# Patient Record
Sex: Female | Born: 1995
Health system: Southern US, Community
[De-identification: ages and names within clinical notes are randomized; demographics above are authoritative.]

## PROBLEM LIST (undated history)

## (undated) DIAGNOSIS — R569 Unspecified convulsions: Secondary | ICD-10-CM

## (undated) HISTORY — PX: NO PAST SURGERIES: SHX2092

## (undated) HISTORY — DX: Unspecified convulsions: R56.9

---
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
## 2013-03-25 ENCOUNTER — Ambulatory Visit (INDEPENDENT_AMBULATORY_CARE_PROVIDER_SITE_OTHER): Payer: Managed Care, Other (non HMO) | Admitting: Family Medicine

## 2013-03-25 VITALS — BP 110/62 | HR 92 | Temp 97.9°F | Resp 18 | Ht 64.5 in | Wt 130.0 lb

## 2013-03-25 DIAGNOSIS — J029 Acute pharyngitis, unspecified: Secondary | ICD-10-CM

## 2013-03-25 LAB — POCT CBC
Granulocyte percent: 66.3 %G (ref 37–80)
HCT, POC: 44.7 % (ref 37.7–47.9)
Hemoglobin: 14 g/dL (ref 12.2–16.2)
Lymph, poc: 2.4 (ref 0.6–3.4)
MCHC: 31.3 g/dL — AB (ref 31.8–35.4)
MID (cbc): 0.6 (ref 0–0.9)
MPV: 11 fL (ref 0–99.8)
POC Granulocyte: 6 (ref 2–6.9)
POC MID %: 6.9 %M (ref 0–12)
RBC: 4.88 M/uL (ref 4.04–5.48)
RDW, POC: 14.6 %
WBC: 9 10*3/uL (ref 4.6–10.2)

## 2013-03-25 LAB — POCT RAPID STREP A (OFFICE): Rapid Strep A Screen: NEGATIVE

## 2013-03-25 MED ORDER — AZITHROMYCIN 250 MG PO TABS: ORAL_TABLET | ORAL | Status: DC

## 2013-03-25 NOTE — Progress Notes (Addendum)
[facility] [address] [place] [address] [phone]  Date:  03/25/2013   Name:  Kaitlyn Kim   DOB:  1996/05/17   MRN:  [id number]  PCP:  Carmin Richmond, MD    Chief Complaint: Sore Throat   History of Present Illness:  Timmy Cleverly is a 17 y.o. very pleasant female patient who presents with the following:  She has noted a sore and red throat for about 3 weeks- it has been getting worse.  Several of her friends at [employer] have the same thing.   She is not aware of any fever, no chills or aches.   She has had a cough, runny nose, headaches.  No fatigue.   No GI symptms.  She did have a seizure several years ago, but this has not occurred again.  She is otherwise generally healthy.   Here today with her mom  She is currently studying to be a Engineer, civil (consulting).    Patient Active Problem List   Diagnosis Date Noted  . Generalized nonconvulsive epilepsy without mention of intractable epilepsy 09/11/2012  . Transient alteration of awareness 09/11/2012    Past Medical History  Diagnosis Date  . Seizures     HX of seizures 3 years ago    Past Surgical History  Procedure Laterality Date  . Epilepsy      History  Substance Use Topics  . Smoking status: Never Smoker   . Smokeless tobacco: Never Used  . Alcohol Use: Yes     Comment: Patient rarely drinks alcohol.    Family History  Problem Relation Age of Onset  . Hyperlipidemia Mother   . Hypertension Maternal Grandmother   . ALS Maternal Grandmother   . Cancer Paternal Grandmother     lung cancer  . COPD Paternal Grandmother   . COPD Paternal Grandfather     No Known Allergies  Medication list has been reviewed and updated.  No current outpatient prescriptions on file prior to visit.   No current facility-administered medications on file prior to visit.    Review of Systems:  As per HPI- otherwise negative.   Physical Examination: Filed Vitals:   03/25/13 1238  BP: 110/62  Pulse:  92  Temp: 97.9 F (36.6 C)  Resp: 18   Filed Vitals:   03/25/13 1238  Height: 5' 4.5" (1.638 m)  Weight: 130 lb (58.968 kg)   Body mass index is 21.98 kg/(m^2). Ideal Body Weight: Weight in (lb) to have BMI = 25: 147.6  GEN: WDWN, NAD, Non-toxic, A & O x 3, looks well HEENT: Atraumatic, Normocephalic. Neck supple. No masses, No LAD.  Oropharynx injected bilaterally with minimal exudate, no evidence of abscess.  Nasal cavity congested.  PEERL Ears and Nose: No external deformity. CV: RRR, No M/G/R. No JVD. No thrill. No extra heart sounds. PULM: CTA B, no wheezes, crackles, rhonchi. No retractions. No resp. distress. No accessory muscle use. ABD: S, NT, ND. No rebound. No HSM. EXTR: No c/c/e NEURO Normal gait.  PSYCH: Normally interactive. Conversant. Not depressed or anxious appearing.  Calm demeanor.   Results for orders placed in visit on 03/25/13  POCT RAPID STREP A (OFFICE)      Result Value Range   Rapid Strep A Screen Negative  Negative  POCT CBC      Result Value Range   WBC 9.0  4.6 - 10.2 K/uL   Lymph, poc 2.4  0.6 - 3.4   POC LYMPH PERCENT 26.8  10 - 50 %L   MID (cbc) 0.6  0 - 0.9   POC MID % 6.9  0 - 12 %M   POC Granulocyte 6.0  2 - 6.9   Granulocyte percent 66.3  37 - 80 %G   RBC 4.88  4.04 - 5.48 M/uL   Hemoglobin 14.0  12.2 - 16.2 g/dL   HCT, POC 40.9  81.1 - 47.9 %   MCV 91.6  80 - 97 fL   MCH, POC 28.7  27 - 31.2 pg   MCHC 31.3 (*) 31.8 - 35.4 g/dL   RDW, POC 91.4     Platelet Count, POC 227  142 - 424 K/uL   MPV 11.0  0 - 99.8 fL     Assessment and Plan: Acute pharyngitis - Plan: POCT rapid strep A, POCT CBC, Culture, Group A Strep, Epstein-Barr virus VCA antibody panel, azithromycin (ZITHROMAX) 250 MG tablet  Pharyngitis suggestive of bacterial infection, also consider EBV.  Treat with azithromycin.  If not better please let me know.  Will plan further follow- up pending labs.  Signed Abbe Amsterdam, MD  10/16:  Called to check on her. LMOM.   Mono titer suggestive of recent EBV infection.  Be sure to rest, avoid injury to abdomen and conserve energy for school.  Throat culture negative for strep.  If not better soon please let me know.

## 2013-03-25 NOTE — Patient Instructions (Signed)
I will be in touch with the rest of your labs. If you are getting worse in the meantime please let me know.

## 2013-03-26 LAB — EPSTEIN-BARR VIRUS VCA ANTIBODY PANEL
EBV EA IgG: 9.9 U/mL — ABNORMAL HIGH (ref <9.0)
EBV NA IgG: 214 U/mL — ABNORMAL HIGH (ref <18.0)
EBV VCA IgG: 218 U/mL — ABNORMAL HIGH (ref <18.0)
EBV VCA IgM: >160 U/mL — ABNORMAL HIGH (ref <36.0)

## 2013-03-27 LAB — CULTURE, GROUP A STREP: Organism ID, Bacteria: NORMAL

## 2013-03-28 ENCOUNTER — Encounter: Payer: Self-pay | Admitting: Family Medicine

## 2013-12-11 ENCOUNTER — Ambulatory Visit (INDEPENDENT_AMBULATORY_CARE_PROVIDER_SITE_OTHER): Payer: Managed Care, Other (non HMO) | Admitting: Family Medicine

## 2013-12-11 VITALS — BP 102/80 | HR 63 | Temp 98.0°F | Resp 18 | Ht 64.5 in | Wt 132.6 lb

## 2013-12-11 DIAGNOSIS — L509 Urticaria, unspecified: Secondary | ICD-10-CM

## 2013-12-11 MED ORDER — HYDROXYZINE HCL 10 MG PO TABS: 10.0000 mg | ORAL_TABLET | Freq: Three times a day (TID) | ORAL | Status: DC | PRN

## 2013-12-11 NOTE — Progress Notes (Signed)
   Subjective:    Patient ID: Kaitlyn Kim, female    DOB: 05-19-1996, 18 y.o.   MRN: [id number]  HPI This is a very pleasant 18 yo female who is accompanied by her mother. Patient has been having generalized itching and intermittent, daily hives for about 4 months. She is currently home from college for the summer. [facility] at the onset of symptoms and was given triamcinolone cream which did not help. She was seen at an urgent care center in [place] and was prescribed a week of medication that she thinks was prednisone (end of April). This helped a great deal but did not clear her symptoms completely. She has tried elimination of various foods and body lotions/soaps/shampoos. Has recently taken zyrtec daily without relief. She has no history of medication/food/plant allergies.  Did not take any medication prior to the onset of hives. States that she was sick off an on all year while at college. She had mono in the fall and the flu in the spring. She admits to having a stressful year, but reports that her stress level is currently low while at home for the summer.  No current medical problems. Had childhood epilepsy- resolved No surgery  Review of Systems No current medication, no runny nose, no itchy eyes, no sore throat.    Objective:   Physical Exam  Vitals reviewed. Constitutional: She appears well-developed and well-nourished.  HENT:  Head: Normocephalic and atraumatic.  Right Ear: External ear normal.  Left Ear: External ear normal.  Nose: Nose normal.  Mouth/Throat: Oropharynx is clear and moist. No oropharyngeal exudate.  Eyes: Conjunctivae are normal. Right eye exhibits no discharge. Left eye exhibits no discharge. No scleral icterus.  Neck: Normal range of motion. Neck supple.  Cardiovascular: Normal rate, regular rhythm and normal heart sounds.   Pulmonary/Chest: Effort normal and breath sounds normal.  Musculoskeletal: Normal range of motion.    Lymphadenopathy:    She has no cervical adenopathy.  Neurological: She is alert.  Skin: Skin is warm and dry. Rash: right inner thigh with 3 small erythematous papules. Patient and mother of pictures of what appears to be scattered wheals. on patient's thighs.  Psychiatric: She has a normal mood and affect. Her behavior is normal. Judgment and thought content normal.      Assessment & Plan:  1. Urticaria - hydrOXYzine (ATARAX/VISTARIL) 10 MG tablet; Take 1 tablet (10 mg total) by mouth 3 (three) times daily as needed for itching.  Dispense: 30 tablet; Refill: 0 - Ambulatory referral to Dermatology  2. Hives - Ambulatory referral to Dermatology  -Discussed possible causes with patient and her mother- post viral, physical/psychological stress reaction.   Emi Belfasteborah B. Gessner, FNP-BC  [facility]  12/11/2013 8:50 PM

## 2013-12-11 NOTE — Patient Instructions (Signed)
Long acting antihistamine daily until referral appointment Atarax every 4-6 hours as needed for itching- may be sedating  Hives Hives are itchy, red, swollen areas of the skin. They can vary in size and location on your body. Hives can come and go for hours or several days (acute hives) or for several weeks (chronic hives). Hives do not spread from person to person (noncontagious). They may get worse with scratching, exercise, and emotional stress. CAUSES   Allergic reaction to food, additives, or drugs.  Infections, including the common cold.  Illness, such as vasculitis, lupus, or thyroid disease.  Exposure to sunlight, heat, or cold.  Exercise.  Stress.  Contact with chemicals. SYMPTOMS   Red or white swollen patches on the skin. The patches may change size, shape, and location quickly and repeatedly.  Itching.  Swelling of the hands, feet, and face. This may occur if hives develop deeper in the skin. DIAGNOSIS  Your caregiver can usually tell what is wrong by performing a physical exam. Skin or blood tests may also be done to determine the cause of your hives. In some cases, the cause cannot be determined. TREATMENT  Mild cases usually get better with medicines such as antihistamines. Severe cases may require an emergency epinephrine injection. If the cause of your hives is known, treatment includes avoiding that trigger.  HOME CARE INSTRUCTIONS   Avoid causes that trigger your hives.  Take antihistamines as directed by your caregiver to reduce the severity of your hives. Non-sedating or low-sedating antihistamines are usually recommended. Do not drive while taking an antihistamine.  Take any other medicines prescribed for itching as directed by your caregiver.  Wear loose-fitting clothing.  Keep all follow-up appointments as directed by your caregiver. SEEK MEDICAL CARE IF:   You have persistent or severe itching that is not relieved with medicine.  You have painful  or swollen joints. SEEK IMMEDIATE MEDICAL CARE IF:   You have a fever.  Your tongue or lips are swollen.  You have trouble breathing or swallowing.  You feel tightness in the throat or chest.  You have abdominal pain. These problems may be the first sign of a life-threatening allergic reaction. Call your local emergency services (911 in [place] MAKE SURE YOU:   Understand these instructions.  Will watch your condition.  Will get help right away if you are not doing well or get worse. Document Released: 05/30/2005 Document Revised: 06/04/2013 Document Reviewed: 08/23/2011 [facility] Patient Information 2015 [employer] [place]. This information is not intended to replace advice given to you by your health care provider. Make sure you discuss any questions you have with your health care provider.

## 2013-12-12 ENCOUNTER — Telehealth: Payer: Self-pay

## 2013-12-12 MED ORDER — PREDNISONE 20 MG PO TABS: ORAL_TABLET | ORAL | Status: DC

## 2013-12-12 NOTE — Telephone Encounter (Signed)
MOTHER CALLED ON BEHALF OF PATIENT STATING THAT WHEN THE PATIENT WAS SEEN YESTERDAY FOR ITCHINESS SHE WAS PRESCRIBED A MEDICATION THAT ISN'T HELPING. THE MOTHER ALSO SAID THAT THE PATIENT IS ITCHING REALLY BAD, CRYING AND WOULD LIKE TO KNOW WHAT SHE CAN DO. PLEASE CALL PATINETS' MOTHER! [phone]

## 2013-12-12 NOTE — Telephone Encounter (Signed)
please advise.

## 2013-12-12 NOTE — Telephone Encounter (Signed)
Will send prednisone taper to pharmacy. She needs to continue the atarax as prescribed as well as the zyrtec. F/u with derm as soon as she has an appt

## 2013-12-13 NOTE — Telephone Encounter (Signed)
Lm for pt to rtn call- rx sent to pharmacy.

## 2014-06-19 ENCOUNTER — Ambulatory Visit (INDEPENDENT_AMBULATORY_CARE_PROVIDER_SITE_OTHER): Payer: PRIVATE HEALTH INSURANCE | Admitting: Family Medicine

## 2014-06-19 VITALS — BP 120/70 | HR 74 | Temp 98.0°F | Resp 16 | Ht 64.0 in | Wt 127.6 lb

## 2014-06-19 DIAGNOSIS — L299 Pruritus, unspecified: Secondary | ICD-10-CM

## 2014-06-19 DIAGNOSIS — R21 Rash and other nonspecific skin eruption: Secondary | ICD-10-CM

## 2014-06-19 MED ORDER — RANITIDINE HCL 150 MG PO TABS: 150.0000 mg | ORAL_TABLET | Freq: Two times a day (BID) | ORAL | Status: DC

## 2014-06-19 MED ORDER — CETIRIZINE HCL 10 MG PO TABS: 10.0000 mg | ORAL_TABLET | Freq: Every day | ORAL | Status: DC

## 2014-06-19 MED ORDER — METHYLPREDNISOLONE 32 MG PO TABS: ORAL_TABLET | ORAL | Status: DC

## 2014-06-19 NOTE — Progress Notes (Signed)
   Subjective:    Patient ID: Kaitlyn Kim, female    DOB: [birth year], 19 y.o.   MRN: [id number]  HPI Patient presents with rash on neck, torso, arms, hands, legs, and feet that started May 2015. Rash improves and worsens without warning, but is always there. It itches intensely, but is not painful. Rash not worse with cold, dry air or following baths. No new soaps or detergents, however, does use scented body wash. Has tried discontinuing different foods in her diet, but nothing changed. Has been seen at 3 differently facilities including [facility] and was thought to have eczema, scabies, and an allergic reaction. Was treated with Triamcinolone, Ivermectin, Prednisone, and Methylprednisolone. Roommate in college did not get scabies and that treatment did not help. Triamcinolone gave some improvement. Prednisone was not tolerated. Methylprednisolone helped the most, however, only had five days worth of medication and upon completion of pills rash came back. Not currently taking any medications. NKDA.   Review of Systems  Constitutional: Negative for fever, chills and fatigue.  Eyes: Negative for itching.  Respiratory: Negative for shortness of breath and wheezing.   Skin: Positive for rash (spares face and back). Negative for color change, pallor and wound.  Allergic/Immunologic: Negative for environmental allergies and food allergies.  Hematological: Negative for adenopathy.       Objective:   Physical Exam  Constitutional: She is oriented to person, place, and time. She appears well-developed and well-nourished. No distress.  Blood pressure 120/70, pulse 74, temperature 98 F (36.7 C), temperature source Oral, resp. rate 16, height 5\' 4"  (1.626 m), weight 127 lb 9.6 oz (57.879 kg), last menstrual period 06/11/2014, SpO2 97 %.  HENT:  Head: Normocephalic and atraumatic.  Right Ear: External ear normal.  Left Ear: External ear normal.  Eyes: Right eye exhibits no discharge. Left eye exhibits no  discharge. No scleral icterus.  Neck: Normal range of motion. Neck supple.  Lymphadenopathy:    She has no cervical adenopathy.  Neurological: She is alert and oriented to person, place, and time.  Skin: Skin is warm and dry. Rash (diffuse sparing the face and back. Fine, red papules. Dermatographic reaction present.) noted. She is not diaphoretic. No erythema. No pallor.       Assessment & Plan:  1. Itching 2. Rash and nonspecific skin eruption If no improvement with current treatment can refer to allergist. - cetirizine (ZYRTEC) 10 MG tablet; Take 1 tablet (10 mg total) by mouth daily.  Dispense: 30 tablet; Refill: 11 - ranitidine (ZANTAC) 150 MG tablet; Take 1 tablet (150 mg total) by mouth 2 (two) times daily.  Dispense: 60 tablet; Refill: 0 - methylPREDNISolone (MEDROL) 32 MG tablet; Take 48 mg x2 day, 32 mg x3 days, 16 mg x5 days, 8 mg x2 days  Dispense: 10 tablet; Refill: 0   Orpah Hausner PA-C  [facility] 06/19/2014 6:28 PM

## 2014-07-09 ENCOUNTER — Telehealth: Payer: Self-pay

## 2014-07-09 NOTE — Telephone Encounter (Signed)
Dr. Conley RollsLe so you have any other suggestions? Should pt RTC?

## 2014-07-09 NOTE — Telephone Encounter (Signed)
Patient has been seen at our office recently for hives/rash. Per patients mom she is still broke out and they are not sure what to do. Mom is requesting to speak with a nurse to get advice on what they should do next. Cathy's (mom) call back number is [phone]

## 2014-07-09 NOTE — Telephone Encounter (Signed)
Spoke with mom, patient in class, she is ok with mom talking to me. Kaitlyn Kim has had this for over a year, she has tried many different things, steroids help but once she is on the downward taper it returns. She ahs seen derm before but was not actively itching so nothing was done, mom is worried about systemic causes which is reasonable and would like a referral to allergiest but inurance wont cover for allergy testing. Advise to call them again and ask specifically about urticaria and also if cover dermatology. Will refer prn once she gets info. She can return to office for basic blood work and possible gluten allergy testing.

## 2014-07-10 ENCOUNTER — Ambulatory Visit (INDEPENDENT_AMBULATORY_CARE_PROVIDER_SITE_OTHER): Payer: PRIVATE HEALTH INSURANCE | Admitting: Family Medicine

## 2014-07-10 VITALS — BP 110/64 | HR 83 | Temp 98.7°F | Resp 18 | Ht 64.0 in | Wt 128.2 lb

## 2014-07-10 DIAGNOSIS — R21 Rash and other nonspecific skin eruption: Secondary | ICD-10-CM

## 2014-07-10 MED ORDER — TRIAMCINOLONE ACETONIDE 0.1 % EX CREA: 1.0000 "application " | TOPICAL_CREAM | Freq: Two times a day (BID) | CUTANEOUS | Status: DC

## 2014-07-10 MED ORDER — MONTELUKAST SODIUM 10 MG PO TABS: 10.0000 mg | ORAL_TABLET | Freq: Every day | ORAL | Status: DC

## 2014-07-10 NOTE — Progress Notes (Signed)
[facility] [address], [place] [id number] [phone]  Date:  07/10/2014   Name:  Kaitlyn Kim   DOB:  1995-12-19   MRN:  [id number]  PCP:  Carmin RichmondLARK,WILLIAM D, MD    Chief Complaint: Follow-up   History of Present Illness:  Kaitlyn Pigglicia Laforest is a 19 y.o. very pleasant female patient who presents with the following:  She is here today to follow-up a rash.  She has had this rash since 09/2013.  We saw her in July and she was given atarax.  She has seen dermatology but was told they did not know the cause of her rash, they suggested an allergist. However it seems that her insurance will not pay for her to see an allergist. They would like to get new, better insurance soon.    She was here on 1/7 and was treated with zyrtec, zantac, medrol dose pack. She did do better with the medrol until she dropped to the lower dosage, then her sx returned.  She is not taking zyrtec, not taking benadryl.  Not taking anything right now. OTC meds do not seem to help except the benadryl helps her to sleep.    Normal TSH last summer.  OW she has not really had labs for this so far.   Sx first started at [employer].  Her roommates did not have any sx.  She is now home with her family and now one else in her family has any of these sx.    Patient Active Problem List   Diagnosis Date Noted  . Generalized nonconvulsive epilepsy without mention of intractable epilepsy 09/11/2012  . Transient alteration of awareness 09/11/2012    Past Medical History  Diagnosis Date  . Seizures     HX of seizures 3 years ago    Past Surgical History  Procedure Laterality Date  . Epilepsy      History  Substance Use Topics  . Smoking status: Never Smoker   . Smokeless tobacco: Never Used  . Alcohol Use: Yes     Comment: Patient rarely drinks alcohol.    Family History  Problem Relation Age of Onset  . Hyperlipidemia Mother   . Hypertension Maternal Grandmother   . ALS Maternal Grandmother   .  Cancer Paternal Grandmother     lung cancer  . COPD Paternal Grandmother   . COPD Paternal Grandfather     No Known Allergies  Medication list has been reviewed and updated.  Current Outpatient Prescriptions on File Prior to Visit  Medication Sig Dispense Refill  . cetirizine (ZYRTEC) 10 MG tablet Take 1 tablet (10 mg total) by mouth daily. (Patient not taking: Reported on 07/10/2014) 30 tablet 11  . hydrOXYzine (ATARAX/VISTARIL) 10 MG tablet Take 1 tablet (10 mg total) by mouth 3 (three) times daily as needed for itching. (Patient not taking: Reported on 06/19/2014) 30 tablet 0  . methylPREDNISolone (MEDROL) 32 MG tablet Take 48 mg x2 day, 32 mg x3 days, 16 mg x5 days, 8 mg x2 days (Patient not taking: Reported on 07/10/2014) 10 tablet 0  . predniSONE (DELTASONE) 20 MG tablet Take 3 PO QAM x3days, 2 PO QAM x3days, 1 PO QAM x3days (Patient not taking: Reported on 06/19/2014) 18 tablet 0  . ranitidine (ZANTAC) 150 MG tablet Take 1 tablet (150 mg total) by mouth 2 (two) times daily. (Patient not taking: Reported on 07/10/2014) 60 tablet 0   No current facility-administered medications on file prior to visit.  Review of Systems:  As per HPI- otherwise negative.   Physical Examination: Filed Vitals:   07/10/14 1639  BP: 110/64  Pulse: 83  Temp: 98.7 F (37.1 C)  Resp: 18   Filed Vitals:   07/10/14 1639  Height:  (1.626 m)  Weight: 128 lb 3.2 oz (58.151 kg)   Body mass index is 21.99 kg/(m^2). Ideal Body Weight: Weight in (lb) to have BMI = 25: 145.3  GEN: WDWN, NAD, Non-toxic, A & O x 3, looks well HEENT: Atraumatic, Normocephalic. Neck supple. No masses, No LAD.  Bilateral TM wnl, oropharynx normal.  PEERL,EOMI.   Ears and Nose: No external deformity. CV: RRR, No M/G/R. No JVD. No thrill. No extra heart sounds. PULM: CTA B, no wheezes, crackles, rhonchi. No retractions. No resp. distress. No accessory muscle use. EXTR: No c/c/e NEURO Normal gait.  PSYCH: Normally  interactive. Conversant. Not depressed or anxious appearing.  Calm demeanor.  Looks well.  Mild, scattered, discrete papular palpable rash on her trunk and legs. Most on her abdomen   Assessment and Plan: Rash and nonspecific skin eruption - Plan: CBC, Comprehensive metabolic panel, C-reactive protein, Sedimentation rate, montelukast (SINGULAIR) 10 MG tablet, triamcinolone cream (KENALOG) 0.1 %  Non- specific rash of nearly a year's duration.   Await labs as above.  Will be conservative with testing for now as cost is an issue. She will try singulair, ok to use benadryl at night She may try some triamcinolone topically as well See patient instructions for more details.     Signed Abbe Amsterdam, MD

## 2014-07-10 NOTE — Patient Instructions (Addendum)
I will be in touch with your labs asap.  Let me know if you are getting worse  Try the singulair once a day to see if it will help.   Take short, lukewarm showers and apply cetaphil as soon as you get out.  Keep your skin moist! Protect from cold or heat.    You can also use some of the triamcinolone cream a few times a week on your skin as needed.  Not on your face

## 2014-07-11 LAB — COMPREHENSIVE METABOLIC PANEL
ALT: 11 U/L (ref 0–35)
AST: 13 U/L (ref 0–37)
Albumin: 4.3 g/dL (ref 3.5–5.2)
Alkaline Phosphatase: 56 U/L (ref 39–117)
BUN: 14 mg/dL (ref 6–23)
CO2: 23 mEq/L (ref 19–32)
Calcium: 9.1 mg/dL (ref 8.4–10.5)
Chloride: 105 mEq/L (ref 96–112)
Creat: 0.74 mg/dL (ref 0.50–1.10)
Glucose, Bld: 84 mg/dL (ref 70–99)
POTASSIUM: 4.2 meq/L (ref 3.5–5.3)
Sodium: 139 mEq/L (ref 135–145)
Total Bilirubin: 0.5 mg/dL (ref 0.2–1.2)
Total Protein: 6.5 g/dL (ref 6.0–8.3)

## 2014-07-11 LAB — C-REACTIVE PROTEIN

## 2014-07-11 LAB — CBC
HCT: 38.2 % (ref 36.0–46.0)
Hemoglobin: 12.7 g/dL (ref 12.0–15.0)
MCH: 28.6 pg (ref 26.0–34.0)
MCHC: 33.2 g/dL (ref 30.0–36.0)
MCV: 86 fL (ref 78.0–100.0)
MPV: 12.4 fL (ref 8.6–12.4)
PLATELETS: 186 10*3/uL (ref 150–400)
RBC: 4.44 MIL/uL (ref 3.87–5.11)
RDW: 13.3 % (ref 11.5–15.5)
WBC: 5.9 10*3/uL (ref 4.0–10.5)

## 2014-07-11 LAB — SEDIMENTATION RATE: SED RATE: 1 mm/h (ref 0–22)

## 2014-07-12 ENCOUNTER — Encounter: Payer: Self-pay | Admitting: Family Medicine

## 2014-07-13 ENCOUNTER — Other Ambulatory Visit: Payer: Self-pay | Admitting: Physician Assistant

## 2014-07-13 DIAGNOSIS — L299 Pruritus, unspecified: Secondary | ICD-10-CM

## 2014-07-15 NOTE — Telephone Encounter (Signed)
Dr Patsy Lageropland, do you want pt taking this? RF req'd but pt reported not taking at the 07/10/14 OV, wasn't on your plan. OK to RF?

## 2015-09-16 ENCOUNTER — Ambulatory Visit (INDEPENDENT_AMBULATORY_CARE_PROVIDER_SITE_OTHER): Payer: PRIVATE HEALTH INSURANCE | Admitting: Urgent Care

## 2015-09-16 VITALS — BP 120/60 | HR 80 | Temp 98.3°F | Resp 18 | Ht 64.5 in | Wt 124.0 lb

## 2015-09-16 DIAGNOSIS — M542 Cervicalgia: Secondary | ICD-10-CM

## 2015-09-16 DIAGNOSIS — M25511 Pain in right shoulder: Secondary | ICD-10-CM

## 2015-09-16 DIAGNOSIS — M6249 Contracture of muscle, multiple sites: Secondary | ICD-10-CM

## 2015-09-16 DIAGNOSIS — M62838 Other muscle spasm: Secondary | ICD-10-CM

## 2015-09-16 MED ORDER — CYCLOBENZAPRINE HCL 5 MG PO TABS: 5.0000 mg | ORAL_TABLET | Freq: Three times a day (TID) | ORAL | Status: DC | PRN

## 2015-09-16 MED ORDER — NAPROXEN SODIUM 550 MG PO TABS: 550.0000 mg | ORAL_TABLET | Freq: Two times a day (BID) | ORAL | Status: DC

## 2015-09-16 NOTE — Patient Instructions (Addendum)
Muscle Cramps and Spasms Muscle cramps and spasms occur when a muscle or muscles tighten and you have no control over this tightening (involuntary muscle contraction). They are a common problem and can develop in any muscle. The most common place is in the calf muscles of the leg. Both muscle cramps and muscle spasms are involuntary muscle contractions, but they also have differences:   Muscle cramps are sporadic and painful. They may last a few seconds to a quarter of an hour. Muscle cramps are often more forceful and last longer than muscle spasms.  Muscle spasms may or may not be painful. They may also last just a few seconds or much longer. CAUSES  It is uncommon for cramps or spasms to be due to a serious underlying problem. In many cases, the cause of cramps or spasms is unknown. Some common causes are:   Overexertion.   Overuse from repetitive motions (doing the same thing over and over).   Remaining in a certain position for a long period of time.   Improper preparation, form, or technique while performing a sport or activity.   Dehydration.   Injury.   Side effects of some medicines.   Abnormally low levels of the salts and ions in your blood (electrolytes), especially potassium and calcium. This could happen if you are taking water pills (diuretics) or you are pregnant.  Some underlying medical problems can make it more likely to develop cramps or spasms. These include, but are not limited to:   Diabetes.   Parkinson disease.   Hormone disorders, such as thyroid problems.   Alcohol abuse.   Diseases specific to muscles, joints, and bones.   Blood vessel disease where not enough blood is getting to the muscles.  HOME CARE INSTRUCTIONS   Stay well hydrated. Drink enough water and fluids to keep your urine clear or pale yellow.  It may be helpful to massage, stretch, and relax the affected muscle.  For tight or tense muscles, use a warm towel, heating  pad, or hot shower water directed to the affected area.  If you are sore or have pain after a cramp or spasm, applying ice to the affected area may relieve discomfort.  Put ice in a plastic bag.  Place a towel between your skin and the bag.  Leave the ice on for 15-20 minutes, 03-04 times a day.  Medicines used to treat a known cause of cramps or spasms may help reduce their frequency or severity. Only take over-the-counter or prescription medicines as directed by your caregiver. SEEK MEDICAL CARE IF:  Your cramps or spasms get more severe, more frequent, or do not improve over time.  MAKE SURE YOU:   Understand these instructions.  Will watch your condition.  Will get help right away if you are not doing well or get worse.   This information is not intended to replace advice given to you by your health care provider. Make sure you discuss any questions you have with your health care provider.   Document Released: 11/19/2001 Document Revised: 09/24/2012 Document Reviewed: 05/16/2012 Elsevier Interactive Patient Education 2016 [employer].     IF you received an x-ray today, you will receive an invoice from [facility]. Please contact [facility] at [phone] with questions or concerns regarding your invoice.   IF you received labwork today, you will receive an invoice from [employer]. Please contact [facility] at [phone] with questions or concerns regarding your invoice.   Our billing staff  will not be able to assist you with questions regarding bills from these companies.  You will be contacted with the lab results as soon as they are available. The fastest way to get your results is to activate your My Chart account. Instructions are located on the last page of this paperwork. If you have not heard from [place] regarding the results in 2 weeks, please contact this office.

## 2015-09-16 NOTE — Progress Notes (Signed)
    MRN: [id number] DOB: 18-Feb-1996  Subjective:   Kaitlyn Kim is a 20 y.o. female presenting for chief complaint of Back Pain  Reports ~1 week history of neck and upper back pain, R>L. The pain is achy with intermittent sharp pains, radiates to her right arm and feels like a throbbing sensation there, also has numbness and tingling of her right hand. Has tried Advil and APAP with some relief. 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 Denies trauma, weakness. Has a history of seizures, has not had one for the past 6 years and is not currently taking any medications. Denies smoking cigarettes or drinking alcohol.   Kaitlyn Kim currently has no medications in their medication list. Also has No Known Allergies.  Kaitlyn Kim  has a past medical history of Seizures (HCC). Also  has past surgical history that includes epilepsy.  Objective:   Vitals: BP 120/60 mmHg  Pulse 80  Temp(Src) 98.3 F (36.8 C) (Oral)  Resp 18  Ht 5' 4.5" (1.638 m)  Wt 124 lb (56.246 kg)  BMI 20.96 kg/m2  SpO2 98%  Physical Exam  Constitutional: She is oriented to person, place, and time. She appears well-developed and well-nourished.  Eyes: EOM are normal. Pupils are equal, round, and reactive to light.  Cardiovascular: Normal rate.   Pulmonary/Chest: Effort normal.  Musculoskeletal:       Cervical back: She exhibits tenderness (over area outlined) and spasm (over trapezius bilaterally, R>>L). She exhibits normal range of motion, no bony tenderness, no swelling, no edema, no deformity and no laceration.       Back:  Mildly positive Hawkin test. Negative Spurling, Neer, Empty Can tests.  Neurological: She is alert and oriented to person, place, and time. She has normal reflexes. Coordination normal.  Skin: Skin is warm and dry.   Assessment and Plan :   1. Neck pain 2. Cervical paraspinal muscle spasm 3. Right shoulder  pain - Likely due to over-use. Counseled on rest and modifications of activities, work restrictions provided. Patient is to start NSAID with Flexeril. Recheck in 2 weeks if no improvement. Consider imaging, PT, referral to ortho.  Kaitlyn BambergMario Syler Norcia, PA-C [facility] [phone] 09/16/2015 5:06 PM

## 2015-10-14 ENCOUNTER — Ambulatory Visit (INDEPENDENT_AMBULATORY_CARE_PROVIDER_SITE_OTHER): Payer: PRIVATE HEALTH INSURANCE | Admitting: Physician Assistant

## 2015-10-14 VITALS — BP 110/72 | HR 112 | Temp 98.9°F | Resp 18 | Ht 64.5 in | Wt 120.0 lb

## 2015-10-14 DIAGNOSIS — J029 Acute pharyngitis, unspecified: Secondary | ICD-10-CM | POA: Diagnosis not present

## 2015-10-14 LAB — POCT RAPID STREP A (OFFICE): RAPID STREP A SCREEN: NEGATIVE

## 2015-10-14 MED ORDER — AMOXICILLIN 875 MG PO TABS: 875.0000 mg | ORAL_TABLET | Freq: Two times a day (BID) | ORAL | Status: AC

## 2015-10-14 NOTE — Patient Instructions (Addendum)
   IF you received an x-ray today, you will receive an invoice from [facility]. Please contact [facility] at [phone] with questions or concerns regarding your invoice.   IF you received labwork today, you will receive an invoice from [facility]. Please contact [facility] at [phone] with questions or concerns regarding your invoice.   Our billing staff will not be able to assist you with questions regarding bills from these companies.  You will be contacted with the lab results as soon as they are available. The fastest way to get your results is to activate your My Chart account. Instructions are located on the last page of this paperwork. If you have not heard from us regarding the results in 2 weeks, please contact this office.    Strep Throat Strep throat is a bacterial infection of the throat. Your health care provider may call the infection tonsillitis or pharyngitis, depending on whether there is swelling in the tonsils or at the back of the throat. Strep throat is most common during the cold months of the year in children who are 5-15 years of age, but it can happen during any season in people of any age. This infection is spread from person to person (contagious) through coughing, sneezing, or close contact. CAUSES Strep throat is caused by the bacteria called Streptococcus pyogenes. RISK FACTORS This condition is more likely to develop in:  People who spend time in crowded places where the infection can spread easily.  People who have close contact with someone who has strep throat. SYMPTOMS Symptoms of this condition include:  Fever or chills.   Redness, swelling, or pain in the tonsils or throat.  Pain or difficulty when swallowing.  White or yellow spots on the tonsils or throat.  Swollen, tender glands in the neck or under the jaw.  Red rash all over the body (rare). DIAGNOSIS This condition is diagnosed by  performing a rapid strep test or by taking a swab of your throat (throat culture test). Results from a rapid strep test are usually ready in a few minutes, but throat culture test results are available after one or two days. TREATMENT This condition is treated with antibiotic medicine. HOME CARE INSTRUCTIONS Medicines  Take over-the-counter and prescription medicines only as told by your health care provider.  Take your antibiotic as told by your health care provider. Do not stop taking the antibiotic even if you start to feel better.  Have family members who also have a sore throat or fever tested for strep throat. They may need antibiotics if they have the strep infection. Eating and Drinking  Do not share food, drinking cups, or personal items that could cause the infection to spread to other people.  If swallowing is difficult, try eating soft foods until your sore throat feels better.  Drink enough fluid to keep your urine clear or pale yellow. General Instructions  Gargle with a salt-water mixture 3-4 times per day or as needed. To make a salt-water mixture, completely dissolve -1 tsp of salt in 1 cup of warm water.  Make sure that all household members wash their hands well.  Get plenty of rest.  Stay home from school or work until you have been taking antibiotics for 24 hours.  Keep all follow-up visits as told by your health care provider. This is important. SEEK MEDICAL CARE IF:  The glands in your neck continue to get bigger.  You develop a rash, cough, or earache.    You cough up a thick liquid that is green, yellow-brown, or bloody.  You have pain or discomfort that does not get better with medicine.  Your problems seem to be getting worse rather than better.  You have a fever. SEEK IMMEDIATE MEDICAL CARE IF:  You have new symptoms, such as vomiting, severe headache, stiff or painful neck, chest pain, or shortness of breath.  You have severe throat pain,  drooling, or changes in your voice.  You have swelling of the neck, or the skin on the neck becomes red and tender.  You have signs of dehydration, such as fatigue, dry mouth, and decreased urination.  You become increasingly sleepy, or you cannot wake up completely.  Your joints become red or painful.   This information is not intended to replace advice given to you by your health care provider. Make sure you discuss any questions you have with your health care provider.   Document Released: 05/27/2000 Document Revised: 02/18/2015 Document Reviewed: 09/22/2014 Elsevier Interactive Patient Education 2016 Elsevier Inc.  

## 2015-10-14 NOTE — Progress Notes (Signed)
Patient ID: Kaitlyn Kim, female    DOB: May 29, 1996, 20 y.o.   MRN: [id number]  PCP: Carmin Richmond, MD  Subjective:   Chief Complaint  Patient presents with  . Sore Throat  . Nausea  . Headache    HPI Presents for evaluation of sore throat. She is accompanied by her mother.  Nausea began last week, followed by headache and sore throat about 4 days ago. And a terrible cough. No nasal or sinus congestion. Feels hot, but no fever/chills. No diarrhea. No urinary symptoms. No vomiting. Feels like strep throat she's had in the past.  No known sick contacts.  Not sexually active.   Review of Systems As above. No headache, dizziness. No muscle or joint ache.    Patient Active Problem List   Diagnosis Date Noted  . Generalized nonconvulsive epilepsy without mention of intractable epilepsy 09/11/2012  . Transient alteration of awareness 09/11/2012     Prior to Admission medications   Medication Sig Start Date End Date Taking? Authorizing Provider  cyclobenzaprine (FLEXERIL) 5 MG tablet Take 1-2 tablets (5-10 mg total) by mouth 3 (three) times daily as needed for muscle spasms. 09/16/15  Yes Wallis Bamberg, PA-C  naproxen sodium (ANAPROX DS) 550 MG tablet Take 1 tablet (550 mg total) by mouth 2 (two) times daily with a meal. 09/16/15  Yes Wallis Bamberg, PA-C     No Known Allergies     Objective:  Physical Exam  Constitutional: She is oriented to person, place, and time. She appears well-developed and well-nourished. She is active and cooperative. No distress.  BP 110/72 mmHg  Pulse 112  Temp(Src) 98.9 F (37.2 C) (Oral)  Resp 18  Ht 5' 4.5" (1.638 m)  Wt 120 lb (54.432 kg)  BMI 20.29 kg/m2  SpO2 97%  LMP 09/19/2015  HENT:  Head: Normocephalic and atraumatic.  Right Ear: Hearing, tympanic membrane, external ear and ear canal normal.  Left Ear: Hearing, tympanic membrane, external ear and ear canal normal.  Nose: Nose normal.  Mouth/Throat: Uvula is midline,  oropharynx is clear and moist and mucous membranes are normal. No oral lesions. Normal dentition. No uvula swelling. No oropharyngeal exudate.  Eyes: Conjunctivae and EOM are normal. No scleral icterus.  Neck: Normal range of motion, full passive range of motion without pain and phonation normal. Neck supple. No thyromegaly present.  Cardiovascular: Normal rate, regular rhythm and normal heart sounds.   Pulses:      Radial pulses are 2+ on the right side, and 2+ on the left side.  Pulmonary/Chest: Effort normal and breath sounds normal.  Abdominal: Soft. Bowel sounds are normal. She exhibits no distension and no mass. There is no tenderness. There is no rebound and no guarding.  Lymphadenopathy:       Head (right side): No tonsillar, no preauricular, no posterior auricular and no occipital adenopathy present.       Head (left side): No tonsillar, no preauricular, no posterior auricular and no occipital adenopathy present.    She has no cervical adenopathy.       Right: No supraclavicular adenopathy present.       Left: No supraclavicular adenopathy present.  Neurological: She is alert and oriented to person, place, and time. No sensory deficit.  Skin: Skin is warm, dry and intact. No rash noted. No cyanosis or erythema. Nails show no clubbing.  Psychiatric: She has a normal mood and affect. Her speech is normal and behavior is normal.       Results  for orders placed or performed in visit on 10/14/15  POCT rapid strep A  Result Value Ref Range   Rapid Strep A Screen Negative Negative       Assessment & Plan:   1. Sore throat Treat for Strep, despite negative rapid strep. Await TCx. Supportive care. Anticipatory guidance. - POCT rapid strep A - Culture, Group A Strep - amoxicillin (AMOXIL) 875 MG tablet; Take 1 tablet (875 mg total) by mouth 2 (two) times daily.  Dispense: 20 tablet; Refill: 0   Fernande Brashelle S. Iantha Titsworth, PA-C Physician Assistant-Certified [facility]

## 2015-10-16 LAB — CULTURE, GROUP A STREP: Organism ID, Bacteria: NORMAL

## 2016-05-19 ENCOUNTER — Ambulatory Visit (INDEPENDENT_AMBULATORY_CARE_PROVIDER_SITE_OTHER): Payer: PRIVATE HEALTH INSURANCE | Admitting: Urgent Care

## 2016-05-19 ENCOUNTER — Encounter: Payer: Self-pay | Admitting: Urgent Care

## 2016-05-19 VITALS — BP 94/70 | HR 99 | Temp 98.9°F | Resp 16 | Ht 64.75 in | Wt 125.2 lb

## 2016-05-19 DIAGNOSIS — J3489 Other specified disorders of nose and nasal sinuses: Secondary | ICD-10-CM

## 2016-05-19 DIAGNOSIS — R0981 Nasal congestion: Secondary | ICD-10-CM | POA: Diagnosis not present

## 2016-05-19 DIAGNOSIS — J0110 Acute frontal sinusitis, unspecified: Secondary | ICD-10-CM | POA: Diagnosis not present

## 2016-05-19 DIAGNOSIS — R059 Cough, unspecified: Secondary | ICD-10-CM

## 2016-05-19 DIAGNOSIS — R0982 Postnasal drip: Secondary | ICD-10-CM

## 2016-05-19 DIAGNOSIS — R05 Cough: Secondary | ICD-10-CM

## 2016-05-19 MED ORDER — PSEUDOEPHEDRINE HCL ER 120 MG PO TB12: 120.0000 mg | ORAL_TABLET | Freq: Two times a day (BID) | ORAL | 3 refills | Status: DC

## 2016-05-19 MED ORDER — AMOXICILLIN 875 MG PO TABS: 875.0000 mg | ORAL_TABLET | Freq: Two times a day (BID) | ORAL | 0 refills | Status: AC

## 2016-05-19 MED ORDER — BENZONATATE 100 MG PO CAPS: 100.0000 mg | ORAL_CAPSULE | Freq: Three times a day (TID) | ORAL | 0 refills | Status: DC | PRN

## 2016-05-19 MED ORDER — HYDROCODONE-HOMATROPINE 5-1.5 MG/5ML PO SYRP: 5.0000 mL | ORAL_SOLUTION | Freq: Every evening | ORAL | 0 refills | Status: DC | PRN

## 2016-05-19 NOTE — Progress Notes (Signed)
    MRN: [id number] DOB: Dec 10, 1995  Subjective:   Randel Pigglicia Sadiq is a [age] y.o. female presenting for chief complaint of Cough (with greenish mucus - SXs since 05/04/16) and Sinusitis  Reports 2 week history of worsening productive cough, headaches, body aches, subjective fever. Has also had intermittent ear pain, nasal congestion, post-nasal drainage. Has tried NyQuil, ibuprofen, Excedrin for her headaches. She has had some relief of her headache. Denies chest pain, shob, n/v, abdominal, rashes. Did not have flu shot this past season. Denies history of allergies, asthma. Denies smoking cigarettes.  [name] currently has no medications in their medication list. Also has No Known Allergies.  [name]  has a past medical history of Seizures (HCC). Also  has a past surgical history that includes Laceration repair (1998).   Objective:   Vitals: BP 94/70 (BP Location: Left Arm, Patient Position: Sitting, Cuff Size: Normal)   Pulse 99   Temp 98.9 F (37.2 C) (Oral)   Resp 16   Ht 5' 4.75" (1.645 m)   Wt 125 lb 3.2 oz (56.8 kg)   LMP 05/16/2016   SpO2 98%   BMI 21.00 kg/m   BP Readings from Last 3 Encounters:  05/19/16 94/70  10/14/15 110/72  09/16/15 120/60    Physical Exam  Constitutional: She is oriented to person, place, and time. She appears well-developed and well-nourished.  HENT:  TM's intact bilaterally but no effusions or erythema. Nasal turbinates erythematous with thick mucus, right nasal passage minimally patent, frontal sinus tenderness. Moderate postnasal drip present in cobblestone appearance but without oropharyngeal exudates, erythema or abscesses.   Eyes: Right eye exhibits no discharge. Left eye exhibits no discharge.  Neck: Normal range of motion. Neck supple.  Cardiovascular: Normal rate, regular rhythm and intact distal pulses.  Exam reveals no gallop and no friction rub.   No murmur heard. Pulmonary/Chest: No respiratory distress. She has no wheezes. She has no  rales.  Lymphadenopathy:    She has no cervical adenopathy.  Neurological: She is alert and oriented to person, place, and time.  Skin: Skin is warm and dry.   Assessment and Plan :   1. Acute frontal sinusitis, recurrence not specified 2. Sinus pain 3. Cough 4. Sinus congestion 5. Post-nasal drainage - Will manage as bacterial sinusitis with amoxicillin. Supportive care otherwise, rtc in 4 days if no improvement.  Wallis BambergMario Rasheena Talmadge, PA-C [facility] [phone] 05/19/2016 11:41 AM

## 2016-05-19 NOTE — Patient Instructions (Addendum)
Sinusitis, Adult Sinusitis is soreness and inflammation of your sinuses. Sinuses are hollow spaces in the bones around your face. Your sinuses are located:  Around your eyes.  In the middle of your forehead.  Behind your nose.  In your cheekbones. Your sinuses and nasal passages are lined with a stringy fluid (mucus). Mucus normally drains out of your sinuses. When your nasal tissues become inflamed or swollen, the mucus can become trapped or blocked so air cannot flow through your sinuses. This allows bacteria, viruses, and funguses to grow, which leads to infection. Sinusitis can develop quickly and last for 7?10 days (acute) or for more than 12 weeks (chronic). Sinusitis often develops after a cold. What are the causes? This condition is caused by anything that creates swelling in the sinuses or stops mucus from draining, including:  Allergies.  Asthma.  Bacterial or viral infection.  Abnormally shaped bones between the nasal passages.  Nasal growths that contain mucus (nasal polyps).  Narrow sinus openings.  Pollutants, such as chemicals or irritants in the air.  A foreign object stuck in the nose.  A fungal infection. This is rare. What increases the risk? The following factors may make you more likely to develop this condition:  Having allergies or asthma.  Having had a recent cold or respiratory tract infection.  Having structural deformities or blockages in your nose or sinuses.  Having a weak immune system.  Doing a lot of swimming or diving.  Overusing nasal sprays.  Smoking. What are the signs or symptoms? The main symptoms of this condition are pain and a feeling of pressure around the affected sinuses. Other symptoms include:  Upper toothache.  Earache.  Headache.  Bad breath.  Decreased sense of smell and taste.  A cough that may get worse at night.  Fatigue.  Fever.  Thick drainage from your nose. The drainage is often green and it may  contain pus (purulent).  Stuffy nose or congestion.  Postnasal drip. This is when extra mucus collects in the throat or back of the nose.  Swelling and warmth over the affected sinuses.  Sore throat.  Sensitivity to light. How is this diagnosed? This condition is diagnosed based on symptoms, a medical history, and a physical exam. To find out if your condition is acute or chronic, your health care provider may:  Look in your nose for signs of nasal polyps.  Tap over the affected sinus to check for signs of infection.  View the inside of your sinuses using an imaging device that has a light attached (endoscope). If your health care provider suspects that you have chronic sinusitis, you may also:  Be tested for allergies.  Have a sample of mucus taken from your nose (nasal culture) and checked for bacteria.  Have a mucus sample examined to see if your sinusitis is related to an allergy. If your sinusitis does not respond to treatment and it lasts longer than 8 weeks, you may have an MRI or CT scan to check your sinuses. These scans also help to determine how severe your infection is. In rare cases, a bone biopsy may be done to rule out more serious types of fungal sinus disease. How is this treated? Treatment for sinusitis depends on the cause and whether your condition is chronic or acute. If a virus is causing your sinusitis, your symptoms will go away on their own within 10 days. You may be given medicines to relieve your symptoms, including:  Topical nasal decongestants. They   shrink swollen nasal passages and let mucus drain from your sinuses.  Antihistamines. These drugs block inflammation that is triggered by allergies. This can help to ease swelling in your nose and sinuses.  Topical nasal corticosteroids. These are nasal sprays that ease inflammation and swelling in your nose and sinuses.  Nasal saline washes. These rinses can help to get rid of thick mucus in your  nose. If your condition is caused by bacteria, you will be given an antibiotic medicine. If your condition is caused by a fungus, you will be given an antifungal medicine. Surgery may be needed to correct underlying conditions, such as narrow nasal passages. Surgery may also be needed to remove polyps. Follow these instructions at home: Medicines  Take, use, or apply over-the-counter and prescription medicines only as told by your health care provider. These may include nasal sprays.  If you were prescribed an antibiotic medicine, take it as told by your health care provider. Do not stop taking the antibiotic even if you start to feel better. Hydrate and Humidify  Drink enough water to keep your urine clear or pale yellow. Staying hydrated will help to thin your mucus.  Use a cool mist humidifier to keep the humidity level in your home above 50%.  Inhale steam for 10-15 minutes, 3-4 times a day or as told by your health care provider. You can do this in the bathroom while a hot shower is running.  Limit your exposure to cool or dry air. Rest  Rest as much as possible.  Sleep with your head raised (elevated).  Make sure to get enough sleep each night. General instructions  Apply a warm, moist washcloth to your face 3-4 times a day or as told by your health care provider. This will help with discomfort.  Wash your hands often with soap and water to reduce your exposure to viruses and other germs. If soap and water are not available, use hand sanitizer.  Do not smoke. Avoid being around people who are smoking (secondhand smoke).  Keep all follow-up visits as told by your health care provider. This is important. Contact a health care provider if:  You have a fever.  Your symptoms get worse.  Your symptoms do not improve within 10 days. Get help right away if:  You have a severe headache.  You have persistent vomiting.  You have pain or swelling around your face or  eyes.  You have vision problems.  You develop confusion.  Your neck is stiff.  You have trouble breathing. This information is not intended to replace advice given to you by your health care provider. Make sure you discuss any questions you have with your health care provider. Document Released: 05/30/2005 Document Revised: 01/24/2016 Document Reviewed: 03/25/2015 Elsevier Interactive Patient Education  2017 Elsevier Inc.     IF you received an x-ray today, you will receive an invoice from [facility]. Please contact [facility] at [phone] with questions or concerns regarding your invoice.   IF you received labwork today, you will receive an invoice from [facility]. Please contact [facility] at [phone] with questions or concerns regarding your invoice.   Our billing staff will not be able to assist you with questions regarding bills from these companies.  You will be contacted with the lab results as soon as they are available. The fastest way to get your results is to activate your My Chart account. Instructions are located on the last page of this paperwork. If   you have not heard from us regarding the results in 2 weeks, please contact this office.      

## 2016-07-24 ENCOUNTER — Encounter (HOSPITAL_COMMUNITY): Payer: Self-pay | Admitting: *Deleted

## 2016-07-24 ENCOUNTER — Observation Stay (HOSPITAL_COMMUNITY)
Admission: EM | Admit: 2016-07-24 | Discharge: 2016-07-25 | Disposition: A | Discharge status: Home / Self Care | Payer: PRIVATE HEALTH INSURANCE | Attending: Internal Medicine | Admitting: Internal Medicine

## 2016-07-24 DIAGNOSIS — G40309 Generalized idiopathic epilepsy and epileptic syndromes, not intractable, without status epilepticus: Secondary | ICD-10-CM | POA: Diagnosis not present

## 2016-07-24 DIAGNOSIS — G40409 Other generalized epilepsy and epileptic syndromes, not intractable, without status epilepticus: Secondary | ICD-10-CM | POA: Diagnosis not present

## 2016-07-24 DIAGNOSIS — R569 Unspecified convulsions: Secondary | ICD-10-CM

## 2016-07-24 DIAGNOSIS — R404 Transient alteration of awareness: Secondary | ICD-10-CM | POA: Diagnosis present

## 2016-07-24 LAB — CBC WITH DIFFERENTIAL/PLATELET
BASOS PCT: 0 %
Basophils Absolute: 0 10*3/uL (ref 0.0–0.1)
EOS ABS: 0.1 10*3/uL (ref 0.0–0.7)
Eosinophils Relative: 2 %
HCT: 37.6 % (ref 36.0–46.0)
HEMOGLOBIN: 12.3 g/dL (ref 12.0–15.0)
Lymphocytes Relative: 33 %
Lymphs Abs: 1.4 10*3/uL (ref 0.7–4.0)
MCH: 28.4 pg (ref 26.0–34.0)
MCHC: 32.7 g/dL (ref 30.0–36.0)
MCV: 86.8 fL (ref 78.0–100.0)
MONO ABS: 0.2 10*3/uL (ref 0.1–1.0)
MONOS PCT: 5 %
NEUTROS PCT: 60 %
Neutro Abs: 2.6 10*3/uL (ref 1.7–7.7)
Platelets: 165 10*3/uL (ref 150–400)
RBC: 4.33 MIL/uL (ref 3.87–5.11)
RDW: 13.2 % (ref 11.5–15.5)
WBC: 4.4 10*3/uL (ref 4.0–10.5)

## 2016-07-24 LAB — URINALYSIS, ROUTINE W REFLEX MICROSCOPIC
BILIRUBIN URINE: NEGATIVE
Bacteria, UA: NONE SEEN
GLUCOSE, UA: NEGATIVE mg/dL
Ketones, ur: NEGATIVE mg/dL
LEUKOCYTES UA: NEGATIVE
NITRITE: NEGATIVE
PH: 5 (ref 5.0–8.0)
Protein, ur: NEGATIVE mg/dL
SPECIFIC GRAVITY, URINE: 1.021 (ref 1.005–1.030)

## 2016-07-24 LAB — BASIC METABOLIC PANEL
Anion gap: 8 (ref 5–15)
BUN: 13 mg/dL (ref 6–20)
CHLORIDE: 107 mmol/L (ref 101–111)
CO2: 24 mmol/L (ref 22–32)
CREATININE: 0.73 mg/dL (ref 0.44–1.00)
Calcium: 9.4 mg/dL (ref 8.9–10.3)
GFR calc non Af Amer: >60 mL/min (ref >60)
GLUCOSE: 95 mg/dL (ref 65–99)
Potassium: 4.1 mmol/L (ref 3.5–5.1)
Sodium: 139 mmol/L (ref 135–145)

## 2016-07-24 LAB — I-STAT BETA HCG BLOOD, ED (MC, WL, AP ONLY)

## 2016-07-24 LAB — CREATININE, SERUM
Creatinine, Ser: 0.61 mg/dL (ref 0.44–1.00)
GFR calc Af Amer: >60 mL/min (ref >60)

## 2016-07-24 LAB — CBC
HEMATOCRIT: 35.7 % — AB (ref 36.0–46.0)
HEMOGLOBIN: 11.6 g/dL — AB (ref 12.0–15.0)
MCH: 28 pg (ref 26.0–34.0)
MCHC: 32.5 g/dL (ref 30.0–36.0)
MCV: 86.2 fL (ref 78.0–100.0)
Platelets: 177 10*3/uL (ref 150–400)
RBC: 4.14 MIL/uL (ref 3.87–5.11)
RDW: 13.1 % (ref 11.5–15.5)
WBC: 10.9 10*3/uL — ABNORMAL HIGH (ref 4.0–10.5)

## 2016-07-24 LAB — TROPONIN I
Troponin I: <0.03 ng/mL (ref <0.03)
Troponin I: <0.03 ng/mL (ref <0.03)

## 2016-07-24 MED ORDER — LORAZEPAM 2 MG/ML IJ SOLN
2.0000 mg | Freq: Once | INTRAMUSCULAR | Status: AC
  Administered 2016-07-24: 2 mg via INTRAVENOUS

## 2016-07-24 MED ORDER — LORAZEPAM 2 MG/ML IJ SOLN: 0.5000 mg | Freq: Once | INTRAMUSCULAR | Status: DC

## 2016-07-24 MED ORDER — LORAZEPAM 2 MG/ML IJ SOLN
INTRAMUSCULAR | Status: AC
  Filled 2016-07-24: qty 1

## 2016-07-24 MED ORDER — LEVETIRACETAM 500 MG PO TABS
500.0000 mg | ORAL_TABLET | Freq: Two times a day (BID) | ORAL | Status: DC
Start: 2016-07-24 — End: 2016-07-25
  Administered 2016-07-24 – 2016-07-25 (×2): 500 mg via ORAL
  Filled 2016-07-24 (×2): qty 1

## 2016-07-24 MED ORDER — METOCLOPRAMIDE HCL 5 MG/ML IJ SOLN
10.0000 mg | Freq: Once | INTRAMUSCULAR | Status: AC
  Administered 2016-07-24: 10 mg via INTRAVENOUS
  Filled 2016-07-24: qty 2

## 2016-07-24 MED ORDER — LORAZEPAM 2 MG/ML IJ SOLN: 0.5000 mg | INTRAMUSCULAR | Status: DC | PRN

## 2016-07-24 MED ORDER — ACETAMINOPHEN 325 MG PO TABS
650.0000 mg | ORAL_TABLET | Freq: Four times a day (QID) | ORAL | Status: DC | PRN
  Administered 2016-07-24: 650 mg via ORAL
  Filled 2016-07-24: qty 2

## 2016-07-24 MED ORDER — ONDANSETRON HCL 4 MG PO TABS
4.0000 mg | ORAL_TABLET | Freq: Four times a day (QID) | ORAL | Status: DC | PRN
  Administered 2016-07-24: 4 mg via ORAL
  Filled 2016-07-24: qty 1

## 2016-07-24 MED ORDER — ENOXAPARIN SODIUM 40 MG/0.4ML ~~LOC~~ SOLN
40.0000 mg | SUBCUTANEOUS | Status: DC
  Administered 2016-07-24 – 2016-07-25 (×2): 40 mg via SUBCUTANEOUS
  Filled 2016-07-24 (×2): qty 0.4

## 2016-07-24 MED ORDER — ONDANSETRON HCL 4 MG/2ML IJ SOLN: 4.0000 mg | Freq: Four times a day (QID) | INTRAMUSCULAR | Status: DC | PRN

## 2016-07-24 MED ORDER — SODIUM CHLORIDE 0.9 % IV SOLN
1000.0000 mg | Freq: Once | INTRAVENOUS | Status: AC
  Administered 2016-07-24: 1000 mg via INTRAVENOUS
  Filled 2016-07-24: qty 10

## 2016-07-24 MED ORDER — ACETAMINOPHEN 650 MG RE SUPP: 650.0000 mg | Freq: Four times a day (QID) | RECTAL | Status: DC | PRN

## 2016-07-24 MED ORDER — SODIUM CHLORIDE 0.9 % IV BOLUS (SEPSIS)
1000.0000 mL | Freq: Once | INTRAVENOUS | Status: AC
  Administered 2016-07-24: 1000 mL via INTRAVENOUS

## 2016-07-24 NOTE — H&P (Signed)
History and Physical    Kaitlyn Kim ZOX:[id number] DOB: November 28, 1995 DOA: 07/24/2016  PCP: Carmin RichmondLARK,WILLIAM D, MD   Patient coming from: home  Chief Complaint: seizure  HPI: Kaitlyn Pigglicia Walters is a 21 y.o. female with medical history significant of seizures since 2014,  not currently on antiepileptics, presenting to the ED after having to episodes of seizures this morning. Her boyfriend reported this morning she started making a grunting noise followed by 20-30 seconds of tonic-clonic seizure. After seizing, she was postictal for about 30 minutes after, and had subsequent seizures during which she seemd to bite her tongue and was having trouble breathing. The boyfriend reported this is not uncommon for the patient to have seizures first thing in the morning upon awaking and it has been present since December . She first started having seizures around age [age] and briefly took Lamictal that caused GI upset. This was switched to Keppra, but she has been off of this medication for several years now with  Dr. Darl HouseholderHickling's permossion.   Patient has no current neurologist. She did see a pediatric neurologist in the past - Dr. Sharene SkeansHickling who authorized stopping Keppra.  She denied any recent fever or illness. No illicit drugs or alcohol use. Denieed chest pain or shortness of breath.   ED Course: On arrival patient was awake, alert, fully oriented. Complained of feeling tired. Keppra load IV was started and patient during this load patient developed third episode seizures.  Review of Systems: As per HPI otherwise 10 point review of systems negative.   Ambulatory Status: independent  Past Medical History:  Diagnosis Date  . Seizures (HCC)    HX of seizures 3 years ago    Past Surgical History:  Procedure Laterality Date  . LACERATION REPAIR  1998   forehead    Social History   Social History  . Marital status: Single    Spouse name: n/a  . Number of children: 0  . Years of education: N/A    Occupational History  . student     [employer], psychology and criminology   Social History Main Topics  . Smoking status: Never Smoker  . Smokeless tobacco: Never Used  . Alcohol use Yes     Comment: Patient rarely drinks alcohol.  . Drug use: No  . Sexual activity: No   Other Topics Concern  . Not on file   Social History Narrative   Lives with her parents and one brother. The other brother is currently in jail Electronics engineer[place]    No Known Allergies  Family History  Problem Relation Age of Onset  . Hyperlipidemia Mother   . Hypertension Maternal Grandmother   . ALS Maternal Grandmother   . Cancer Paternal Grandmother     lung cancer  . COPD Paternal Grandmother   . COPD Paternal Grandfather     Prior to Admission medications   Medication Sig Start Date End Date Taking? Authorizing Provider  benzonatate (TESSALON) 100 MG capsule Take 1-2 capsules (100-200 mg total) by mouth 3 (three) times daily as needed for cough. Patient not taking: Reported on 07/24/2016 05/19/16   Wallis BambergMario Mani, PA-C  HYDROcodone-homatropine [facility](HYCODAN) 5-1.5 MG/5ML syrup Take 5 mLs by mouth at bedtime as needed. Patient not taking: Reported on 07/24/2016 05/19/16   Wallis BambergMario Mani, PA-C  pseudoephedrine (SUDAFED 12 HOUR) 120 MG 12 hr tablet Take 1 tablet (120 mg total) by mouth 2 (two) times daily. Patient not taking: Reported on 07/24/2016 05/19/16   Wallis BambergMario Mani, PA-C  Physical Exam: Vitals:   07/24/16 1300 07/24/16 1315 07/24/16 1330 07/24/16 1338  BP: (!) 95/48 (!) 88/46 (!) 93/49   Pulse: 101 94 102   Resp:  20    Temp:    98.9 F (37.2 C)  TempSrc:      SpO2: 96% 100% 97%   Weight:      Height:         General: Appears calm and comfortable Eyes: PERRLA, EOMI, normal lids, iris ENT:  grossly normal hearing, tongue has bite marking on the right side, mucous membranes moist and intact Neck: no lymphoadenopathy, masses or thyromegaly Cardiovascular: RRR, no m/r/g. No JVD, carotid bruits. No LE  edema.  Respiratory: bilateral no wheezes, rales, rhonchi or cracles. Normal respiratory effort. No accessory muscle use observed Abdomen: soft, non-tender, non-distended, no organomegaly or masses appreciated. BS present in all quadrants Skin: no rash, ulcers or induration seen on limited exam Musculoskeletal: grossly normal tone BUE/BLE, good ROM, no bony abnormality or joint deformities observed Psychiatric: grossly normal mood and affect, speech fluent and appropriate, alert and oriented x3 Neurologic: CN II-XII grossly intact, moves all extremities in coordinated fashion, sensation intact  Labs on Admission: I have personally reviewed following labs and imaging studies  CBC, BMP  GFR: Estimated Creatinine Clearance: 95.3 mL/min (by C-G formula based on SCr of 0.73 mg/dL).  Creatinine Clearance: Estimated Creatinine Clearance: 95.3 mL/min (by C-G formula based on SCr of 0.73 mg/dL).  Radiological Exams on Admission: No results found.  EKG: Independently reviewed - sinus rhythm with RBB block, questionable anterolateral QW with non specific abnormality of T waves, no other EKG for comparison  Assessment/Plan Principal Problem:   Generalized nonconvulsive epilepsy (HCC) Active Problems:   Transient alteration of awareness   Seizure disorder Continue Keppra, admit for observation on seizure precautions Neurology consult requested  Transient alteration of awareness - secondary to postictal state Continue to monitor Recheck BMP next morning  Questionable EKG changes - will cycle troponin, repeat EKG in the morning  DVT prophylaxis: Lovenox Code Status: Full Family Communication: at bedside Disposition Plan: MedSurg Consults called: Neurology by EDP Admission status: observation   Raymon Mutton, PA-C Pager: [phone] [facility]  If 7PM-7AM, please contact night-coverage [email or web address] Password [email or web address]  07/24/2016, 2:04 PM

## 2016-07-24 NOTE — ED Triage Notes (Signed)
Per MS- pt had 2 seizures at home lasting a minute each. Pt was reported to have been postictal for 30 minutes. Pt reported nausea prior to seizure. Seizures were witnessed by boyfriend. Pt received 4mg  of zofran with no relief of nausea. Pt alert and oriented at this time. Pt in NAD

## 2016-07-24 NOTE — ED Notes (Signed)
Pt will open eyes to name. Family at bedside. Side rails padded.

## 2016-07-24 NOTE — ED Provider Notes (Signed)
[facility]-EMER[facility]ENCY DEPT Provider Note   CSN: [id number] Arrival date & time: 07/24/16  0800     History   Chief Complaint Chief Complaint  Patient presents with  . Seizures    HPI Kaitlyn Kim is a 21 y.o. female.  The history is provided by the patient and medical records.  Seizures      21 year old female with history of seizures not currently on antiepileptics, presenting to the ED after seizure 2 morning. Patient reports this morning she started making a grunting noise followed by 20-30 seconds of tonic-clonic seizure. She was postictal for about 30 minutes after, and had subsequent seizures first. [name] reports doing second seizure she seems to bite her tongue was having trouble breathing all the EMS. Patient and boyfriend reports this is common for her to have seizures first thing in the morning upon waking. She first started having seizures around age 29 and briefly took Lamictal (caused GI upset) and was switched to Keppra, but has not been off of this medication for several years now.  States she is not currently followed by a neurologist. She did see a pediatric neurologist in the past. She denies any recent fever or illness. No illicit drugs or alcohol use. Denies any chest pain or shortness of breath. On arrival patient is awake, alert, fully oriented. States she just feels tired at this time.  Past Medical History:  Diagnosis Date  . Seizures (HCC)    HX of seizures 3 years ago    Patient Active Problem List   Diagnosis Date Noted  . Generalized nonconvulsive epilepsy without mention of intractable epilepsy 09/11/2012  . Transient alteration of awareness 09/11/2012    Past Surgical History:  Procedure Laterality Date  . LACERATION REPAIR  1998   forehead    OB History    No data available       Home Medications    Prior to Admission medications   Medication Sig Start Date End Date Taking? Authorizing Provider  benzonatate (TESSALON) 100 MG capsule Take  1-2 capsules (100-200 mg total) by mouth 3 (three) times daily as needed for cough. 05/19/16   Wallis Bamberg, PA-C  HYDROcodone-homatropine [facility]) 5-1.5 MG/5ML syrup Take 5 mLs by mouth at bedtime as needed. 05/19/16   Wallis Bamberg, PA-C  pseudoephedrine (SUDAFED 12 HOUR) 120 MG 12 hr tablet Take 1 tablet (120 mg total) by mouth 2 (two) times daily. 05/19/16   Wallis Bamberg, PA-C    Family History Family History  Problem Relation Age of Onset  . Hyperlipidemia Mother   . Hypertension Maternal Grandmother   . ALS Maternal Grandmother   . Cancer Paternal Grandmother     lung cancer  . COPD Paternal Grandmother   . COPD Paternal Grandfather     Social History Social History  Substance Use Topics  . Smoking status: Never Smoker  . Smokeless tobacco: Never Used  . Alcohol use Yes     Comment: Patient rarely drinks alcohol.     Allergies   Patient has no known allergies.   Review of Systems Review of Systems  Neurological: Positive for seizures.  All other systems reviewed and are negative.    Physical Exam Updated Vital Signs BP 111/65 (BP Location: Right Arm)   Pulse 92   Temp 98 F (36.7 C) (Oral)   Resp 18   LMP 07/20/2016   SpO2 99%   Physical Exam  Constitutional: She is oriented to person, place, and time. She appears well-developed and well-nourished.  HENT:  Head: Normocephalic and atraumatic.  Mouth/Throat: Oropharynx is clear and moist.  Abrasion noted to right side of tongue; dentition intact; mid face stable  Eyes: Conjunctivae and EOM are normal. Pupils are equal, round, and reactive to light.  Neck: Normal range of motion.  Cardiovascular: Normal rate, regular rhythm and normal heart sounds.   Pulmonary/Chest: Effort normal and breath sounds normal. No respiratory distress. She has no wheezes.  Abdominal: Soft. Bowel sounds are normal. There is no tenderness. There is no rebound.  Musculoskeletal: Normal range of motion.  Neurological: She is alert and  oriented to person, place, and time.  AAOx3, answering questions and following commands appropriately; equal strength UE and LE bilaterally; CN grossly intact; moves all extremities appropriately without ataxia; no focal neuro deficits or facial asymmetry appreciated  Skin: Skin is warm and dry.  Psychiatric: She has a normal mood and affect.  Nursing note and vitals reviewed.    ED Treatments / Results  Labs (all labs ordered are listed, but only abnormal results are displayed) Labs Reviewed - No data to display  EKG  EKG Interpretation None       Radiology No results found.  Procedures Procedures (including critical care time)  Medications Ordered in ED Medications  metoCLOPramide (REGLAN) injection 10 mg (10 mg Intravenous Given 07/24/16 0913)  levETIRAcetam (KEPPRA) 1,000 mg in sodium chloride 0.9 % 100 mL IVPB (0 mg Intravenous Stopped 07/24/16 1200)  LORazepam (ATIVAN) injection 2 mg (2 mg Intravenous Given 07/24/16 1116)  sodium chloride 0.9 % bolus 1,000 mL (1,000 mLs Intravenous New Bag/Given 07/24/16 1340)     Initial Impression / Assessment and Plan / ED Course  I have reviewed the triage vital signs and the nursing notes.  Pertinent labs & imaging results that were available during my care of the patient were reviewed by me and considered in my medical decision making (see chart for details).  21 year old female here with seizure. History of same, has not been on antiepileptics in several years. Seizures seem to be increasing in frequency over the past few months, she has had 5 total in the past 2 months. She is awake, alert, appropriately oriented on arrival here. She has a small abrasion to the right side of her tongue but no other signs of serious head trauma. Neurologic exam is nonfocal. Screening lab work obtained, no acute findings. She denies any recent alcohol or illicit drug use. EKG is nonischemic. Given her increasing frequency, she may need to restart  seizure medications. Will discuss with neurology for recommendations.  Discussed with neurology, recommend to restart her Keppra and have her follow-up with outpatient neurology. 1g IV keppra loading dose ordered here.  As nursing was trying to hang her IV keppra patient had another grand-mal seizure.  This lasted for a few seconds, cessation with ativan.  lV keppra given.  Vitals remain stable.  Given recurrent seizure here-- re-discussed with neurology, will admit for observation and close monitoring.  If questions or additional concerns, can call him back.  Medicine will admit.  Final Clinical Impressions(s) / ED Diagnoses   Final diagnoses:  Seizure [facility](HCC)    New Prescriptions New Prescriptions   No medications on file     Garlon HatchetLisa M Sanders, PA-C 07/24/16 [address]1408    Lisa M Sanders, PA-C 07/24/16 1415    Alvira MondayErin Schlossman, MD 07/27/16 1005

## 2016-07-24 NOTE — ED Notes (Signed)
Pt had seizure while Kaitlyn Kim was about to hang keppra, pt is aggitated and combative. Given 2mg  ativan.

## 2016-07-24 NOTE — Consult Note (Signed)
Reason for Consult: Seizures Referring Physician:  ED  Kaitlyn Kim is an 21 y.o. female.  HPI: Pt with prior history of seizures since age 64 till age 21. She was on Keppra then and started to have seizures again since age 44 approximately every 6 months. For the past 3 months she has had 3 episodes of seizures and each time they happened two times. She has not seen a Neurologist for these yet and came in today to ER after having 2 seizures. Her boyfriend reported that around 6:45AM she had a seizure with eyes rolling back in her head and then had stiffening of her arms and legs with some jerks lasting about 20 seconds and then she fell asleep. She woke up around 30 min afterwards and started to talk but then had another. She was brought to ER and was being sent home when she had a 3rd episode similar fashion and decision was made to admit her. Parents reports she is under significant stress due to education (senior in college). Graduating with Psych degree and criminal psychology. She reports no aura during the episode and no loss of bowel or bladder control. There is no tongue bit during either episode. She has been sleeping since her visit from Er. She reports back to normal at this time. Denied any complaints.  Past Medical History:  Diagnosis Date  . Seizures ([place])    HX of seizures 3 years ago    Past Surgical History:  Procedure Laterality Date  . LACERATION REPAIR  1998   forehead    Family History  Problem Relation Age of Onset  . Hyperlipidemia Mother   . Hypertension Maternal Grandmother   . ALS Maternal Grandmother   . Cancer Paternal Grandmother     lung cancer  . COPD Paternal Grandmother   . COPD Paternal Grandfather     Social History:  reports that she has never smoked. She has never used smokeless tobacco. She reports that she drinks alcohol. She reports that she does not use drugs.  Allergies: No Known Allergies  Medications:  Prior to Admission:  No  prescriptions prior to admission.   Scheduled: . enoxaparin (LOVENOX) injection  40 mg Subcutaneous Q24H  . levETIRAcetam  500 mg Oral BID   Continuous:   Results for orders placed or performed during the hospital encounter of 07/24/16 (from the past 48 hour(s))  CBC with Differential     Status: None   Collection Time: 07/24/16  8:27 AM  Result Value Ref Range   WBC 4.4 4.0 - 10.5 K/uL   RBC 4.33 3.87 - 5.11 MIL/uL   Hemoglobin 12.3 12.0 - 15.0 g/dL   HCT 37.6 36.0 - 46.0 %   MCV 86.8 78.0 - 100.0 fL   MCH 28.4 26.0 - 34.0 pg   MCHC 32.7 30.0 - 36.0 g/dL   RDW 13.2 11.5 - 15.5 %   Platelets 165 150 - 400 K/uL   Neutrophils Relative % 60 %   Neutro Abs 2.6 1.7 - 7.7 K/uL   Lymphocytes Relative 33 %   Lymphs Abs 1.4 0.7 - 4.0 K/uL   Monocytes Relative 5 %   Monocytes Absolute 0.2 0.1 - 1.0 K/uL   Eosinophils Relative 2 %   Eosinophils Absolute 0.1 0.0 - 0.7 K/uL   Basophils Relative 0 %   Basophils Absolute 0.0 0.0 - 0.1 K/uL  Basic metabolic panel     Status: None   Collection Time: 07/24/16  8:27 AM  Result  Value Ref Range   Sodium 139 135 - 145 mmol/L   Potassium 4.1 3.5 - 5.1 mmol/L   Chloride 107 101 - 111 mmol/L   CO2 24 22 - 32 mmol/L   Glucose, Bld 95 65 - 99 mg/dL   BUN 13 6 - 20 mg/dL   Creatinine, Ser 0.73 0.44 - 1.00 mg/dL   Calcium 9.4 8.9 - 10.3 mg/dL   GFR calc non Af Amer >60 >60 mL/min   GFR calc Af Amer >60 >60 mL/min    Comment: (NOTE) The eGFR has been calculated using the CKD EPI equation. This calculation has not been validated in all clinical situations. eGFR's persistently <60 mL/min signify possible Chronic Kidney Disease.    Anion gap 8 5 - 15  I-Stat Beta hCG blood, ED ([facility], [facility], [facility] only)     Status: None   Collection Time: 07/24/16  8:36 AM  Result Value Ref Range   I-stat hCG, quantitative <5.0 <5 mIU/mL   Comment 3            Comment:   GEST. AGE      CONC.  (mIU/mL)   <=1 WEEK        5 - 50     2 WEEKS       50 - 500     3 WEEKS        100 - 10,000     4 WEEKS     1,000 - 30,000        FEMALE AND NON-PREGNANT FEMALE:     LESS THAN 5 mIU/mL   Urinalysis, Routine w reflex microscopic     Status: Abnormal   Collection Time: 07/24/16  8:43 AM  Result Value Ref Range   Color, Urine YELLOW YELLOW   APPearance CLEAR CLEAR   Specific Gravity, Urine 1.021 1.005 - 1.030   pH 5.0 5.0 - 8.0   Glucose, UA NEGATIVE NEGATIVE mg/dL   Hgb urine dipstick SMALL (A) NEGATIVE   Bilirubin Urine NEGATIVE NEGATIVE   Ketones, ur NEGATIVE NEGATIVE mg/dL   Protein, ur NEGATIVE NEGATIVE mg/dL   Nitrite NEGATIVE NEGATIVE   Leukocytes, UA NEGATIVE NEGATIVE   RBC / HPF 0-5 0 - 5 RBC/hpf   WBC, UA 0-5 0 - 5 WBC/hpf   Bacteria, UA NONE SEEN NONE SEEN   Squamous Epithelial / LPF 0-5 (A) NONE SEEN  Troponin I (q 6hr x 3)     Status: None   Collection Time: 07/24/16  3:29 PM  Result Value Ref Range   Troponin I <0.03 <0.03 ng/mL  CBC     Status: Abnormal   Collection Time: 07/24/16  3:29 PM  Result Value Ref Range   WBC 10.9 (H) 4.0 - 10.5 K/uL   RBC 4.14 3.87 - 5.11 MIL/uL   Hemoglobin 11.6 (L) 12.0 - 15.0 g/dL   HCT 35.7 (L) 36.0 - 46.0 %   MCV 86.2 78.0 - 100.0 fL   MCH 28.0 26.0 - 34.0 pg   MCHC 32.5 30.0 - 36.0 g/dL   RDW 13.1 11.5 - 15.5 %   Platelets 177 150 - 400 K/uL  Creatinine, serum     Status: None   Collection Time: 07/24/16  3:29 PM  Result Value Ref Range   Creatinine, Ser 0.61 0.44 - 1.00 mg/dL   GFR calc non Af Amer >60 >60 mL/min   GFR calc Af Amer >60 >60 mL/min    Comment: (NOTE) The eGFR has been calculated using the  CKD EPI equation. This calculation has not been validated in all clinical situations. eGFR's persistently <60 mL/min signify possible Chronic Kidney Disease.     No results found.  Review of Systems  Constitutional: Negative.   HENT: Negative.   Eyes: Negative.   Respiratory: Negative.   Cardiovascular: Negative.   Gastrointestinal: Negative.   Genitourinary: Negative.    Musculoskeletal: Negative.   Skin: Negative.   Neurological: Negative.   Endo/Heme/Allergies: Negative.   Psychiatric/Behavioral: Negative.    Blood pressure (!) 95/40, pulse (!) 111, temperature 99.1 F (37.3 C), resp. rate 18, height '5\' 4"'  (1.626 m), weight 56.7 kg (125 lb), last menstrual period 07/20/2016, SpO2 99 %. Physical Exam HEENT-  Normocephalic, no lesions, Neck supple and no rigidity was appreciated. Cardiovascular - regular rate and rhythm, S1, S2 normal, no murmur, click, rub or gallop Lungs - chest clear, no wheezing, rales, normal symmetric air entry, Heart exam - S1, S2 normal, no murmur, no gallop, rate regular Abdomen - soft nontender and nondistended  Neurologic Examination: Mental status: Awake alert and oriented to all spheres. Speech and language: No evidence of dysarthria was appreciated. Comprehension intact. Naming intact. Fluent.  Cranial nerves: Pupils approximately 3 mm and reactive to light. Extra muscles intact however does have very faint right beating nystagmus. Facial sensation symmetric. No facial droop is appreciated. Hearing intact. Uvula midline. Tongue midline. Motor: 5/5 throughout Sensory: Normal sensation to light touch Coordination: Normal finger to nose and heel to shin bilaterally no evidence of dysdiadochokinesia. Gait: Deferred  Assessment/Plan: 21 years old female with prior history of seizure presented with multiple seizures. Likely etiology is primary epilepsy. She has faint right beating nystagmus therefore would like to get MRI Brain w/wo contrast to look for seizure focus.  -MRI Brain w/wo contrast to look for seizure focus -Routine EEG to evaluate for primary epilepsy. -Keppra 566m BID started. She got loading dose of 10010mIV in Er.  Seizure precautions including driving restriction discussed with patient in details. Other seizure precautions such as bathing, heights, heavy machinery operation discussed with patient in  details.  Baili Stang 07/24/2016, 4:53 PM

## 2016-07-25 ENCOUNTER — Observation Stay (HOSPITAL_COMMUNITY): Payer: PRIVATE HEALTH INSURANCE

## 2016-07-25 DIAGNOSIS — G40309 Generalized idiopathic epilepsy and epileptic syndromes, not intractable, without status epilepticus: Secondary | ICD-10-CM | POA: Diagnosis not present

## 2016-07-25 LAB — BASIC METABOLIC PANEL
Anion gap: 6 (ref 5–15)
BUN: 8 mg/dL (ref 6–20)
CALCIUM: 9.1 mg/dL (ref 8.9–10.3)
CO2: 24 mmol/L (ref 22–32)
CREATININE: 0.66 mg/dL (ref 0.44–1.00)
Chloride: 110 mmol/L (ref 101–111)
Glucose, Bld: 87 mg/dL (ref 65–99)
Potassium: 3.8 mmol/L (ref 3.5–5.1)
SODIUM: 140 mmol/L (ref 135–145)

## 2016-07-25 LAB — TROPONIN I: Troponin I: <0.03 ng/mL (ref <0.03)

## 2016-07-25 LAB — CBC
HCT: 34.6 % — ABNORMAL LOW (ref 36.0–46.0)
Hemoglobin: 11.3 g/dL — ABNORMAL LOW (ref 12.0–15.0)
MCH: 28.3 pg (ref 26.0–34.0)
MCHC: 32.7 g/dL (ref 30.0–36.0)
MCV: 86.7 fL (ref 78.0–100.0)
Platelets: 163 10*3/uL (ref 150–400)
RBC: 3.99 MIL/uL (ref 3.87–5.11)
RDW: 13.3 % (ref 11.5–15.5)
WBC: 7.6 10*3/uL (ref 4.0–10.5)

## 2016-07-25 LAB — RAPID URINE DRUG SCREEN, HOSP PERFORMED
AMPHETAMINES: NOT DETECTED
BENZODIAZEPINES: NOT DETECTED
Barbiturates: NOT DETECTED
Cocaine: NOT DETECTED
OPIATES: NOT DETECTED
Tetrahydrocannabinol: NOT DETECTED

## 2016-07-25 MED ORDER — SODIUM CHLORIDE 0.9 % IV BOLUS (SEPSIS)
500.0000 mL | Freq: Once | INTRAVENOUS | Status: AC
Start: 2016-07-25 — End: 2016-07-25
  Administered 2016-07-25: 500 mL via INTRAVENOUS

## 2016-07-25 MED ORDER — SODIUM CHLORIDE 0.9 % IV BOLUS (SEPSIS)
500.0000 mL | Freq: Once | INTRAVENOUS | Status: AC
  Administered 2016-07-25: 500 mL via INTRAVENOUS

## 2016-07-25 MED ORDER — LEVETIRACETAM 500 MG PO TABS: 500.0000 mg | ORAL_TABLET | Freq: Two times a day (BID) | ORAL | 1 refills | Status: DC

## 2016-07-25 MED ORDER — GADOBENATE DIMEGLUMINE 529 MG/ML IV SOLN
10.0000 mL | Freq: Once | INTRAVENOUS | Status: AC
  Administered 2016-07-25: 10 mL via INTRAVENOUS

## 2016-07-25 NOTE — Progress Notes (Signed)
Patient ready for discharge to home; discharge instructions given and reviewed; Rx given; patient discharged accompanied by her family members.

## 2016-07-25 NOTE — Care Management Note (Signed)
Case Management Note  Patient Details  Name: Kaitlyn Kim MRN: [id number] Date of Birth: 1996-03-23  Subjective/Objective:                    Action/Plan: Pt discharging home with self care. Pt with insurance and a PCP. No further needs per CM.   Expected Discharge Date:  07/25/16               Expected Discharge Plan:  Home/Self Care  In-House Referral:     Discharge planning Services     Post Acute Care Choice:    Choice offered to:     DME Arranged:    DME Agency:     HH Arranged:    HH Agency:     Status of Service:  Completed, signed off  If discussed at [employer] Length of Stay Meetings, dates discussed:    Additional Comments:  Kermit BaloKelli F Osamu Olguin, RN 07/25/2016, 5:15 PM

## 2016-07-25 NOTE — Care Management Note (Signed)
Case Management Note  Patient Details  Name: Kaitlyn Kim MRN: [id number] Date of Birth: 11/23/1995  Subjective/Objective:          Patient presented with seizures. Lives at home with boyfriend.  Plan is for discharge home today. No CM needs identified.          Action/Plan:   Expected Discharge Date:  07/25/16               Expected Discharge Plan:     In-House Referral:     Discharge planning Services     Post Acute Care Choice:    Choice offered to:     DME Arranged:    DME Agency:     HH Arranged:    HH Agency:     Status of Service:     If discussed at [employer] Length of [employer] Meetings, dates discussed:    Additional Comments:  Anda KraftRobarge, Chalise Pe C, RN 07/25/2016, 3:31 PM

## 2016-07-25 NOTE — Discharge Summary (Signed)
Physician Discharge Summary  Kaitlyn Kim AVW:[id number] DOB: [birth year] DOA: 07/24/2016  PCP: Carmin Richmond, MD  Admit date: 07/24/2016 Discharge date: 07/25/2016   Recommendations for Outpatient Follow-Up:   1. Seizure precautions 2. Ambulatory referral to neruology   Discharge Diagnosis:   Principal Problem:   Generalized nonconvulsive epilepsy (HCC) Active Problems:   Transient alteration of awareness   Discharge disposition:  Home.   Discharge Condition: Improved.  Diet recommendation: .  Regular.  Wound care: None.   History of Present Illness:   Kaitlyn Kim is a 21 y.o. female with medical history significant of seizures since 2014,  not currently on antiepileptics, presenting to the ED after having to episodes of seizures this morning. Her boyfriend reported this morning she started making a grunting noise followed by 20-30 seconds of tonic-clonic seizure. After seizing, she was postictal for about 30 minutes after, and had subsequent seizures during which she seemd to bite her tongue and was having trouble breathing. The boyfriend reported this is not uncommon for the patient to have seizures first thing in the morning upon awaking and it has been present since December . She first started having seizures around age [age] and briefly took Lamictal that caused GI upset. This was switched to Keppra, but she has been off of this medication for several years now with  Dr. Darl Householder permossion. Patient has no current neurologist. She did see a pediatricneurologist in the past - Dr. Sharene Skeans who authorized stopping Keppra.  She denied any recent fever or illness. No illicit drugs or alcohol use. Denieed chest pain or shortness of breath.    Hospital Course by Problem:   Epilepsy Patient is once again having seizures, now appears to be GTC.  She has been restarted on Keppra which she will continue indefinitely.  She can be discharged from neuro perspective.  She  will need to follow-up with neuro as an outpatient.  She was counseled on seizure precautions and also told not to drink alcohol.   Medical Consultants:   Neuro  Discharge Exam:   Vitals:   07/25/16 1037 07/25/16 1404  BP: 112/63 104/63  Pulse: 80 (!) 105  Resp:  18  Temp: 99.2 F (37.3 C) 98.2 F (36.8 C)   Vitals:   07/25/16 0615 07/25/16 0825 07/25/16 1037 07/25/16 1404  BP: (!) 90/43 110/63 112/63 104/63  Pulse: 84 78 80 (!) 105  Resp: 18   18  Temp: 98.7 F (37.1 C)  99.2 F (37.3 C) 98.2 F (36.8 C)  TempSrc: Oral  Oral Oral  SpO2: 98%   99%  Weight:      Height:        Gen:  NAD    The results of significant diagnostics from this hospitalization (including imaging, microbiology, ancillary and laboratory) are listed below for reference.     Procedures and Diagnostic Studies:   Mr Laqueta Jean WG Contrast  Result Date: 07/25/2016 CLINICAL DATA:  Initial evaluation for seizures. EXAM: MRI HEAD WITHOUT AND WITH CONTRAST TECHNIQUE: Multiplanar, multiecho pulse sequences of the brain and surrounding structures were obtained without and with intravenous contrast. CONTRAST:  10mL MULTIHANCE GADOBENATE DIMEGLUMINE 529 MG/ML IV SOLN COMPARISON:  None available. FINDINGS: Brain: Cerebral volume is normal for patient age. No focal parenchymal signal abnormality identified. No abnormal foci of restricted diffusion to suggest acute or subacute ischemia. Gray-white matter differentiation well maintained. No areas of encephalomalacia to suggest chronic infarction. No evidence for acute or chronic intracranial hemorrhage. No mass lesion,  midline shift, or mass effect. Cortical sulcation is normal. Ventricles normal in size without evidence for hydrocephalus. No extra-axial fluid collection. Major dural sinuses are grossly patent. No abnormal enhancement. Pituitary gland and suprasellar region within normal limits. Midline structures intact and normal. Vascular: Major intracranial  vascular flow voids are well maintained. Skull and upper cervical spine: Craniocervical junction normal. Visualized upper cervical spine unremarkable. Bone marrow signal intensity within normal limits. No scalp soft tissue abnormality. Sinuses/Orbits: Globes and orbital soft tissues within normal limits. Paranasal sinuses are clear. No mastoid effusion. Inner ear structures normal. IMPRESSION: Normal MRI of the brain. Electronically Signed   By: Rise MuBenjamin  McClintock M.D.   On: 07/25/2016 13:44     Labs:   Basic Metabolic Panel:  Recent Labs Lab 07/24/16 0827 07/24/16 1529 07/25/16 0138  NA 139  --  140  K 4.1  --  3.8  CL 107  --  110  CO2 24  --  24  GLUCOSE 95  --  87  BUN 13  --  8  CREATININE 0.73 0.61 0.66  CALCIUM 9.4  --  9.1   GFR Estimated Creatinine Clearance: 95.3 mL/min (by C-G formula based on SCr of 0.66 mg/dL). Liver Function Tests: No results for input(s): AST, ALT, ALKPHOS, BILITOT, PROT, ALBUMIN in the last 168 hours. No results for input(s): LIPASE, AMYLASE in the last 168 hours. No results for input(s): AMMONIA in the last 168 hours. Coagulation profile No results for input(s): INR, PROTIME in the last 168 hours.  CBC:  Recent Labs Lab 07/24/16 0827 07/24/16 1529 07/25/16 0138  WBC 4.4 10.9* 7.6  NEUTROABS 2.6  --   --   HGB 12.3 11.6* 11.3*  HCT 37.6 35.7* 34.6*  MCV 86.8 86.2 86.7  PLT 165 177 163   Cardiac Enzymes:  Recent Labs Lab 07/24/16 1529 07/24/16 1916 07/25/16 0138  TROPONINI <0.03 <0.03 <0.03   BNP: Invalid input(s): POCBNP CBG: No results for input(s): GLUCAP in the last 168 hours. D-Dimer No results for input(s): DDIMER in the last 72 hours. Hgb A1c No results for input(s): HGBA1C in the last 72 hours. Lipid Profile No results for input(s): CHOL, HDL, LDLCALC, TRIG, CHOLHDL, LDLDIRECT in the last 72 hours. Thyroid function studies No results for input(s): TSH, T4TOTAL, T3FREE, THYROIDAB in the last 72 hours.  Invalid  input(s): FREET3 Anemia work up No results for input(s): VITAMINB12, FOLATE, FERRITIN, TIBC, IRON, RETICCTPCT in the last 72 hours. Microbiology No results found for this or any previous visit (from the past 240 hour(s)).   Discharge Instructions:   Discharge Instructions    Ambulatory referral to Neurology    Complete by:  As directed    An appointment is requested in approximately: 2-3 weeks ([facility]   Diet - low sodium heart healthy    Complete by:  As directed    Discharge instructions    Complete by:  As directed    seizure precautions: no driving, no swimming, no operating heavy machinery keppra may increase the sedating properties (CNS effects) of alcohol   Increase activity slowly    Complete by:  As directed      Allergies as of 07/25/2016   No Known Allergies     Medication List    TAKE these medications   levETIRAcetam 500 MG tablet Commonly known as:  KEPPRA Take 1 tablet (500 mg total) by mouth 2 (two) times daily.      Follow-up Information    referral  made to neurology Follow up.        Carmin Richmond, MD Follow up in 1 week(s).   Specialty:  Pediatrics Contact information: [address], [address] [facility] [place] [address] [phone]            Time coordinating discharge: 25 min  Signed:  JESSICA Juanetta Gosling   [facility] 07/25/2016, 2:47 PM

## 2016-07-25 NOTE — Procedures (Signed)
ELECTROENCEPHALOGRAM REPORT  Date of Study: 07/25/2016  Patient's Name: Kaitlyn Kim MRN: [id number] Date of Birth: 03-May-1996  Referring Provider: Luan PullingQaiser Toqeer, MD  Clinical History: 21 year old female with seizure disorder.  Medications: Keppra Ativan Zofran Tylenol  Technical Summary: A multichannel digital EEG recording measured by the international 10-20 system with electrodes applied with paste and impedances below 5000 ohms performed in our laboratory with EKG monitoring in an awake and asleep patient.  Hyperventilation and photic stimulation were performed.  The digital EEG was referentially recorded, reformatted, and digitally filtered in a variety of bipolar and referential montages for optimal display.    Description: The patient is awake and asleep during the recording.  During maximal wakefulness, there is a symmetric, medium voltage 9 Hz posterior dominant rhythm that attenuates with eye opening.  The record is symmetric.  During drowsiness and sleep, there is an increase in theta slowing of the background.  Vertex waves and symmetric sleep spindles were seen.  Hyperventilation and photic stimulation did not elicit any abnormalities.  There were no epileptiform discharges or electrographic seizures seen.    EKG lead was unremarkable.  Impression: This awake and asleep EEG is normal.    Clinical Correlation: A normal EEG does not exclude a clinical diagnosis of epilepsy.  If further clinical questions remain, prolonged EEG may be helpful.  Clinical correlation is advised.   Shon MilletAdam Jaffe, DO

## 2016-07-25 NOTE — Progress Notes (Signed)
Patient's BP 102/36.  Contacted provider, who ordered NS IV bolus.  RN will continue to monitor

## 2016-07-25 NOTE — Progress Notes (Signed)
EEG completed; results pending.    

## 2016-07-25 NOTE — Progress Notes (Signed)
Subjective: This afternoon, her family was at bedside. She has not had any seizure-like activity today which she attributes to IV Keppra.   Exam: Vitals:   07/25/16 1037 07/25/16 1404  BP: 112/63 104/63  Pulse: 80 (!) 105  Resp:  18  Temp: 99.2 F (37.3 C) 98.2 F (36.8 C)    HEENT-  Normocephalic, no lesions, without obvious abnormality.  Normal external eye and conjunctiva. Normal pharynx. Extremities- no joint deformities, effusion, or inflammation and no edema Skin-warm and dry, no hyperpigmentation, vitiligo, or suspicious lesions  Neurologic Exam Gen: In bed, no acute distress MS: Responds to questions appropriately. Alert and oriented x 3. ZO:[id number]:Visual fields intact. EOMI intact. PERRLA. Facial sensation intact. No asymmetry noted with smiling. Hearing intact. Uvula at midline. Shoulder shrug intact. Motor: 5/5 upper and lower extremity strength DTR: 2+ brachial/patellar reflexes  Impression: [name] is a 21 year old woman with history of petit mal seizures hospitalized for new seizure-like activity with no new causes elicited on neuroimaging, urine toxicology, labwork. EEG without findings of epileptiform activity.  Recommendations: 1)Discharge on oral Keppra 500 mg twice daily with plan to follow-up with outpatient neurologist  Heywood Ilesushil Patel, PGY3 Internal Medicine  07/25/2016, 3:16 PM

## 2016-07-25 NOTE — Progress Notes (Signed)
S: No further seizures.  Slightly dizzy and drowsy from Keppra; was told this should gradually improve with time.  O: Alert, conversant Non-focal exam  A/P: 1. Epilepsy Patient is once again having seizures, now appears to be GTC.  She has been restarted on Keppra which she will continue indefinitely.  She can be discharged from neuro perspective.  She will need to follow-up with neuro as an outpatient.  She was counseled on seizure precautions and also told not to drink alcohol.  Dr. Benedict NeedyNath [facility] [phone]  07/25/2016, 3:41 PM

## 2016-08-09 ENCOUNTER — Ambulatory Visit (INDEPENDENT_AMBULATORY_CARE_PROVIDER_SITE_OTHER): Payer: PRIVATE HEALTH INSURANCE | Admitting: Neurology

## 2016-08-09 ENCOUNTER — Encounter: Payer: Self-pay | Admitting: Neurology

## 2016-08-09 VITALS — Ht 64.0 in | Wt 123.2 lb

## 2016-08-09 DIAGNOSIS — G40B09 Juvenile myoclonic epilepsy, not intractable, without status epilepticus: Secondary | ICD-10-CM

## 2016-08-09 MED ORDER — LEVETIRACETAM 500 MG PO TABS: 500.0000 mg | ORAL_TABLET | Freq: Two times a day (BID) | ORAL | 11 refills | Status: DC

## 2016-08-09 NOTE — Patient Instructions (Addendum)
Remember to drink plenty of fluid, eat healthy meals and do not skip any meals. Try to eat protein with a every meal and eat a healthy snack such as fruit or nuts in between meals. Try to keep a regular sleep-wake schedule and try to exercise daily, particularly in the form of walking, 20-30 minutes a day, if you can.   As far as your medications are concerned, I would like to suggest: Continue Keppra. Lamictal is another option.   As far as diagnostic testing: Juvenile Myoclonic Epilepsy?  I would like to see you back in 3 months, sooner if we need to. Please call us with any interim questions, concerns, problems, updates or refill requests.   Our phone number is [phone]. We also have an after hours call service for urgent matters and there is a physician on-call for urgent questions. For any emergencies you know to call 911 or go to the nearest emergency room  Discussed Patients with epilepsy have a small risk of sudden unexpected death, a condition referred to as sudden unexpected death in epilepsy (SUDEP). SUDEP is defined specifically as the sudden, unexpected, witnessed or unwitnessed, nontraumatic and nondrowning death in patients with epilepsy with or without evidence for a seizure, and excluding documented status epilepticus, in which post mortem examination does not reveal a structural or toxicologic cause for death

## 2016-08-09 NOTE — Progress Notes (Signed)
[facility]    Provider:  Dr Lucia Gaskins Referring Provider: Eliberto Ivory, MD Primary Care Physician:  Carmin Richmond, MD  CC:  seizures  HPI:  Kaitlyn Kim is a 21 y.o. female here as a referral from Dr. Chestine Spore for seizures. Patient has a past medical history of late onset absence seizures since the age of 82. She presented to [facility] February, earlier this month, not on any antiepileptics he medications after having episodes of seizures. She started making grunting noises followed by 20-30 seconds of tonic-clonic seizure. He was postictal afterwards. She appeared to bite her tongue and was having trouble breathing. Patient had been having morning seizures for over a month. Seizures started her at 80-19. She had been on Lamictal in the past with side effects. She was switched to Keppra have been off this medication for several years. She did see Dr. Sharene Skeans in the past.She was restarted on Keppra. She is here with parents who also provide information. She had a GTCS in 2015, then January 2017 and then [facility] and in December started increasing in frequency. She screams, head back, eyes wide open, goes on for 1-2 minutes. She had 3 in a row and went to the ED. She is irritable on keppra.   Reviewed notes, labs and imaging from outside physicians, which showed:  Reviewed notes from pediatric neurology.Patient was diagnosed with SEIZURES AT THE AGE OF 11. She was trialed on multiple medications with side effects including Keppra caused aggressiveness, ethosuximide GI upset and panic, lamotrigine was also poorly tolerated.The patient had onset of seizures in 2008.  EEG in January 18, 2008, showed spike and slow wave discharges of 3 Hz and bitemporal slowing. The patient had EEG on June 10, 2011, and on the basis of that, decision was made to taper and discontinue levetiracetam.     Personally reviewed MRI images and agree with the following:  FINDINGS: Brain: Cerebral volume  is normal for patient age. No focal parenchymal signal abnormality identified. No abnormal foci of restricted diffusion to suggest acute or subacute ischemia. Gray-white matter differentiation well maintained. No areas of encephalomalacia to suggest chronic infarction. No evidence for acute or chronic intracranial hemorrhage.  No mass lesion, midline shift, or mass effect. Cortical sulcation is normal. Ventricles normal in size without evidence for hydrocephalus. No extra-axial fluid collection. Major dural sinuses are grossly patent. No abnormal enhancement.  Pituitary gland and suprasellar region within normal limits. Midline structures intact and normal.  Vascular: Major intracranial vascular flow voids are well maintained.  Skull and upper cervical spine: Craniocervical junction normal. Visualized upper cervical spine unremarkable. Bone marrow signal intensity within normal limits. No scalp soft tissue abnormality.  Sinuses/Orbits: Globes and orbital soft tissues within normal limits.  Paranasal sinuses are clear. No mastoid effusion. Inner ear structures normal.  IMPRESSION: Normal MRI of the brain.  EEG was normal. Reviewed report: Impression: This awake and asleep EEG is normal.    Clinical Correlation: A normal EEG does not exclude a clinical diagnosis of epilepsy.  If further clinical questions remain, prolonged EEG may be helpful.  Clinical correlation is advised.  Urine drug screen was negative, BMP normal, CBC with mild anemia 07/25/2016.  Review of Systems: Patient complains of symptoms per HPI as well as the following symptoms: no CP, no SOB. Pertinent negatives per HPI. All others negative.   Social History   Social History  . Marital status: Single    Spouse name: n/a  . Number of children: 0  .  Years of education: 3517   Occupational History  . Student     [employer], psychology and criminology  . [employer]    Social History Main Topics  .  Smoking status: Never Smoker  . Smokeless tobacco: Never Used  . Alcohol use Yes     Comment: Patient rarely drinks alcohol.  . Drug use: No  . Sexual activity: No   Other Topics Concern  . Not on file   Social History Narrative   Lives with her boyfriend and his sister.    Brother is currently in jail Electronics engineer([place] 10[place]2015[place]   Right-handed   Caffeine: none    Family History  Problem Relation Age of Onset  . Hyperlipidemia Mother   . Thyroid disease Mother   . Hypertension Maternal Grandmother   . ALS Maternal Grandmother   . Cancer Paternal Grandmother     lung cancer  . COPD Paternal Grandmother   . COPD Paternal Grandfather   . Seizures Neg Hx     Past Medical History:  Diagnosis Date  . Seizures (HCC)    HX of seizures 3 years ago    Past Surgical History:  Procedure Laterality Date  . LACERATION REPAIR  1998   forehead    Current Outpatient Prescriptions  Medication Sig Dispense Refill  . levETIRAcetam (KEPPRA) 500 MG tablet Take 1 tablet (500 mg total) by mouth 2 (two) times daily. 60 tablet 11  . Multiple Vitamins-Minerals (MULTIVITAMIN ADULT) CHEW Chew by mouth daily.     No current facility-administered medications for this visit.     Allergies as of 08/09/2016  . (No Known Allergies)    Vitals: Ht 5\' 4"  (1.626 m)   Wt 123 lb 3.2 oz (55.9 kg)   LMP 07/20/2016   BMI 21.15 kg/m  Last Weight:  Wt Readings from Last 1 Encounters:  08/09/16 123 lb 3.2 oz (55.9 kg)   Last Height:   Ht Readings from Last 1 Encounters:  08/09/16 5\' 4"  (1.626 m)   Physical exam: Exam: Gen: NAD, conversant, well nourised, well groomed                     CV: RRR, no MRG. No Carotid Bruits. No peripheral edema, warm, nontender Eyes: Conjunctivae clear without exudates or hemorrhage  Neuro: Detailed Neurologic Exam  Speech:    Speech is normal; fluent and spontaneous with normal comprehension.  Cognition:    The patient is oriented to person, place, and time;       recent and remote memory intact;     language fluent;     normal attention, concentration,     fund of knowledge Cranial Nerves:    The pupils are equal, round, and reactive to light. The fundi are normal and spontaneous venous pulsations are present. Visual fields are full to finger confrontation. Extraocular movements are intact. Trigeminal sensation is intact and the muscles of mastication are normal. The face is symmetric. The palate elevates in the midline. Hearing intact. Voice is normal. Shoulder shrug is normal. The tongue has normal motion without fasciculations.   Coordination:    Normal finger to nose and heel to shin. Normal rapid alternating movements.   Gait:    Heel-toe and tandem gait are normal.   Motor Observation:    No asymmetry, no atrophy, and no involuntary movements noted. Tone:    Normal muscle tone.    Posture:    Posture is normal. normal erect    Strength:  Strength is V/V in the upper and lower limbs.      Sensation: intact to LT     Reflex Exam:  DTR's:    Deep tendon reflexes in the upper and lower extremities are normal bilaterally.   Toes:    The toes are downgoing bilaterally.   Clonus:    Clonus is absent.       Assessment/Plan:  21 year old with history of Absence seizures Dxed at the age of 19 with GTCS over the years and recently worsened. Started on Keppra in the ED. Question JME. She wants to continue on Keppra, Lamictal is a good alternative.  Patient is unable to drive, operate heavy machinery, perform activities at heights or participate in water activities until 6 months seizure free  Discussed Patients with epilepsy have a small risk of sudden unexpected death, a condition referred to as sudden unexpected death in epilepsy (SUDEP). SUDEP is defined specifically as the sudden, unexpected, witnessed or unwitnessed, nontraumatic and nondrowning death in patients with epilepsy with or without evidence for a seizure, and  excluding documented status epilepticus, in which post mortem examination does not reveal a structural or toxicologic cause for death   Cc: Dr. Rondall Allegra, MD  [facility] [address] [place], [place] [address]  Phone [phone] Fax [phone]

## 2016-08-10 ENCOUNTER — Telehealth: Payer: Self-pay

## 2016-08-10 NOTE — Telephone Encounter (Signed)
Rx printed, signed, faxed to pharmacy. 

## 2016-09-22 NOTE — Telephone Encounter (Addendum)
Pt called today said she needs refill for keppra. I advised her it had been sent to [facility] 08/10/16 and to check with them. She understood  FYI

## 2016-11-08 ENCOUNTER — Encounter (INDEPENDENT_AMBULATORY_CARE_PROVIDER_SITE_OTHER): Payer: Self-pay

## 2016-11-08 ENCOUNTER — Encounter: Payer: Self-pay | Admitting: Neurology

## 2016-11-08 ENCOUNTER — Ambulatory Visit (INDEPENDENT_AMBULATORY_CARE_PROVIDER_SITE_OTHER): Payer: PRIVATE HEALTH INSURANCE | Admitting: Neurology

## 2016-11-08 VITALS — BP 108/69 | HR 64 | Ht 64.0 in | Wt 121.6 lb

## 2016-11-08 DIAGNOSIS — G40B09 Juvenile myoclonic epilepsy, not intractable, without status epilepticus: Secondary | ICD-10-CM

## 2016-11-08 NOTE — Progress Notes (Signed)
[facility]    Provider:  Dr Lucia GaskinsAhern Referring Provider: Eliberto Ivorylark, William, MD Primary Care Physician:  Eliberto Ivorylark, William, MD  CC:  seizures  Interval history 11/08/2016: Patient returns today for follow-up of seizures since age of 21. She has a history of Seizures and Generalized Tonic-Clonic Seizures. She Was Started on Keppra. Juvenile Myoclonic Epilepsy Is in the Differential. I Last Appointment We Continued Her on Keppra, Discussed Lamictal Is a Good Alternative. She is under stress. Doing well on Keppra no more mood swings. No seizures or staring spells, no jerking movements. We discussed Pradhan staying on Keppra. If she ever has any side effects to the Keppra or changes in mood we can switch to Lamictal. She reports some memory changes which are not uncommon on epilepsy medications we will monitor clinically.  HPI:  Kaitlyn Kim is a 21 y.o. female here as a referral from Dr. Chestine Sporelark for seizures. Patient has a past medical history of late onset absence seizures since the age of 21. She [facility], earlier this month, not on any antiepileptics he medications after having episodes of seizures. She started making grunting noises followed by 20-30 seconds of tonic-clonic seizure. He was postictal afterwards. She appeared to bite her tongue and was having trouble breathing. Patient had been having morning seizures for over a month. Seizures started her at 7018-19. She had been on Lamictal in the past with side effects. She was switched to Keppra have been off this medication for several years. She did see Dr. Sharene SkeansHickling in the past.She was restarted on Keppra. She is here with parents who also provide information. [facility] in 2015, [facility] and in December started increasing in frequency. She screams, head back, eyes wide open, goes on for 1-2 minutes. She had 3 in a row and went to the ED. She is irritable on keppra.   Reviewed  notes, labs and imaging from outside physicians, which showed:  Reviewed notes from pediatric neurology.Patient was diagnosed with SEIZURES AT THE AGE OF 11. She was trialed on multiple medications with side effects including Keppra caused aggressiveness, ethosuximide GI upset and panic, lamotrigine was also poorly tolerated.The patient had onset of seizures in 2008. EEG in January 18, 2008, showed spike and slow wave discharges of 3 Hz and bitemporal slowing. The patient had EEG on June 10, 2011, and on the basis of that, decision was made to taper and discontinue levetiracetam.    Personally reviewed MRI images and agree with the following:  FINDINGS: Brain: Cerebral volume is normal for patient age. No focal parenchymal signal abnormality identified. No abnormal foci of restricted diffusion to suggest acute or subacute ischemia. Gray-white matter differentiation well maintained. No areas of encephalomalacia to suggest chronic infarction. No evidence for acute or chronic intracranial hemorrhage.  No mass lesion, midline shift, or mass effect. Cortical sulcation is normal. Ventricles normal in size without evidence for hydrocephalus. No extra-axial fluid collection. Major dural sinuses are grossly patent. No abnormal enhancement.  Pituitary gland and suprasellar region within normal limits. Midline structures intact and normal.  Vascular: Major intracranial vascular flow voids are well maintained.  Skull and upper cervical spine: Craniocervical junction normal. Visualized upper cervical spine unremarkable. Bone marrow signal intensity within normal limits. No scalp soft tissue abnormality.  Sinuses/Orbits: Globes and orbital soft tissues within normal limits.  Paranasal sinuses are clear. No mastoid effusion. Inner ear structures normal.  IMPRESSION: Normal MRI of the brain.  EEG  was normal. Reviewed report: Impression: This awake and asleepEEG is normal.    Clinical Correlation: A normal EEG does not exclude a clinical diagnosis of epilepsy. If further clinical questions remain, prolonged EEG may be helpful. Clinical correlation is advised.  Urine drug screen was negative, BMP normal, CBC with mild anemia 07/25/2016.  Review of Systems: Patient complains of symptoms per HPI as well as the following symptoms: no CP, no SOB. Pertinent negatives per HPI. All others negative.    Social History   Social History  . Marital status: Single    Spouse name: n/a  . Number of children: 0  . Years of education: 65   Occupational History  . Student     [employer], psychology and criminology  . [employer]    Social History Main Topics  . Smoking status: Never Smoker  . Smokeless tobacco: Never Used  . Alcohol use Yes     Comment: Patient rarely drinks alcohol.  . Drug use: No  . Sexual activity: No   Other Topics Concern  . Not on file   Social History Narrative   Lives with parents   Brother is currently in jail Electronics engineer, [place] 10/2015)   Right-handed   Caffeine: none    Family History  Problem Relation Age of Onset  . Hyperlipidemia Mother   . Thyroid disease Mother   . Hypertension Maternal Grandmother   . ALS Maternal Grandmother   . Cancer Paternal Grandmother        lung cancer  . COPD Paternal Grandmother   . COPD Paternal Grandfather   . Seizures Neg Hx     Past Medical History:  Diagnosis Date  . Seizures (HCC)    HX of seizures 3 years ago    Past Surgical History:  Procedure Laterality Date  . LACERATION REPAIR  1998   forehead    Current Outpatient Prescriptions  Medication Sig Dispense Refill  . levETIRAcetam (KEPPRA) 500 MG tablet Take 1 tablet (500 mg total) by mouth 2 (two) times daily. 60 tablet 11   No current facility-administered medications for this visit.     Allergies as of 11/08/2016  . (No Known Allergies)    Vitals: BP 108/69   Pulse 64   Ht 5\' 4"  (1.626 m)   Wt 121 lb 9.6 oz  (55.2 kg)   BMI 20.87 kg/m  Last Weight:  Wt Readings from Last 1 Encounters:  11/08/16 121 lb 9.6 oz (55.2 kg)   Last Height:   Ht Readings from Last 1 Encounters:  11/08/16 5\' 4"  (1.626 m)   Physical exam: Exam: Gen: NAD, conversant, well nourised, obese, well groomed                     CV: RRR, no MRG. No Carotid Bruits. No peripheral edema, warm, nontender Eyes: Conjunctivae clear without exudates or hemorrhage  Neuro: Detailed Neurologic Exam  Speech:    Speech is normal; fluent and spontaneous with normal comprehension.  Cognition:    The patient is oriented to person, place, and time;     recent and remote memory intact;     language fluent;     normal attention, concentration,     fund of knowledge Cranial Nerves:    The pupils are equal, round, and reactive to light. The fundi are normal and spontaneous venous pulsations are present. Visual fields are full to finger confrontation. Extraocular movements are intact. Trigeminal sensation is intact and the muscles of mastication  are normal. The face is symmetric. The palate elevates in the midline. Hearing intact. Voice is normal. Shoulder shrug is normal. The tongue has normal motion without fasciculations.   Coordination:    Normal finger to nose and heel to shin. Normal rapid alternating movements.   Gait:    Heel-toe and tandem gait are normal.   Motor Observation:    No asymmetry, no atrophy, and no involuntary movements noted. Tone:    Normal muscle tone.    Posture:    Posture is normal. normal erect    Strength:    Strength is V/V in the upper and lower limbs.      Sensation: intact to LT     Reflex Exam:  DTR's:    Deep tendon reflexes in the upper and lower extremities are normal bilaterally.   Toes:    The toes are downgoing bilaterally.   Clonus:    Clonus is absent.   Assessment/Plan:  21 year old with history of Absence seizures Dxed at the age of 40 with GTCS over the years and recently  worsened. Started on Keppra in the ED. Question JME. She wants to continue on Keppra, Lamictal is a good alternative.  There were side effects to the Keppra. She did report moodiness which has improved. She is reporting some memory changes which is not uncommon on epilepsy medications. We will monitor and in the future consider Lamictal.  No seizures, she is stable and doing well.  Last seizure was in February so she can drive again in August. Discussed seizure precautions again today especially not driving, or doing anything that would hurt herself or others should she have a seizure. We also discussed sudden unexpected death in epilepsy patients in the best way to avoid that is to be compliant with her medications. Discussed medication compliance as well. Missing one dose of her Keppra could cause of breakthrough seizure. Patient is to email with any questions or call immediately for any seizure activity.  Patient is unable to drive, operate heavy machinery, perform activities at heights or participate in water activities until 6 months seizure free  Discussed Patients with epilepsy have a small risk of sudden unexpected death, a condition referred to as sudden unexpected death in epilepsy (SUDEP). SUDEP is defined specifically as the sudden, unexpected, witnessed or unwitnessed, nontraumatic and nondrowning death in patients with epilepsy with or without evidence for a seizure, and excluding documented status epilepticus, in which post mortem examination does not reveal a structural or toxicologic cause for death   Cc: Dr. Rondall Allegra, MD  [facility] [address] [place], [place] [address]  Phone [phone] Fax [phone]  A total of 15 minutes was spent face-to-face with this patient. Over half this time was spent on counseling patient on the JME diagnosis and different diagnostic and therapeutic options available.

## 2016-11-08 NOTE — Patient Instructions (Signed)
Remember to drink plenty of fluid, eat healthy meals and do not skip any meals. Try to eat protein with a every meal and eat a healthy snack such as fruit or nuts in between meals. Try to keep a regular sleep-wake schedule and try to exercise daily, particularly in the form of walking, 20-30 minutes a day, if you can.   As far as your medications are concerned, I would like to suggest: Keppra   I would like to see you back in 1 year, sooner if we need to. Please call us with any interim questions, concerns, problems, updates or refill requests.   Our phone number is [phone]. We also have an after hours call service for urgent matters and there is a physician on-call for urgent questions. For any emergencies you know to call 911 or go to the nearest emergency room

## 2016-12-19 ENCOUNTER — Encounter: Payer: Self-pay | Admitting: Urgent Care

## 2016-12-19 ENCOUNTER — Ambulatory Visit (INDEPENDENT_AMBULATORY_CARE_PROVIDER_SITE_OTHER): Payer: PRIVATE HEALTH INSURANCE | Admitting: Urgent Care

## 2016-12-19 VITALS — BP 126/79 | HR 71 | Temp 97.6°F | Resp 16 | Ht 64.0 in | Wt 120.8 lb

## 2016-12-19 DIAGNOSIS — N3090 Cystitis, unspecified without hematuria: Secondary | ICD-10-CM | POA: Diagnosis not present

## 2016-12-19 DIAGNOSIS — R350 Frequency of micturition: Secondary | ICD-10-CM

## 2016-12-19 DIAGNOSIS — R300 Dysuria: Secondary | ICD-10-CM | POA: Diagnosis not present

## 2016-12-19 LAB — POC MICROSCOPIC URINALYSIS (UMFC): Mucus: ABSENT

## 2016-12-19 LAB — POCT URINALYSIS DIP (MANUAL ENTRY)
BILIRUBIN UA: NEGATIVE
GLUCOSE UA: NEGATIVE mg/dL
Ketones, POC UA: NEGATIVE mg/dL
Nitrite, UA: NEGATIVE
Protein Ur, POC: NEGATIVE mg/dL
RBC UA: NEGATIVE
SPEC GRAV UA: 1.015 (ref 1.010–1.025)
UROBILINOGEN UA: 0.2 U/dL
pH, UA: 7 (ref 5.0–8.0)

## 2016-12-19 MED ORDER — NITROFURANTOIN MONOHYD MACRO 100 MG PO CAPS: 100.0000 mg | ORAL_CAPSULE | Freq: Two times a day (BID) | ORAL | 0 refills | Status: DC

## 2016-12-19 NOTE — Progress Notes (Signed)
   MRN: [id number] DOB: April 11, 1996  Subjective:   Kaitlyn Kim is a 21 y.o. female presenting for chief complaint of Urinary Frequency (x2-3 days denies N/V ) and Dysuria (denies any blood)  Reports 3 day history of dysuria, urinary frequency, cloudy malordorous urine and vaginal discharge. Admits the vaginal discharge was mild and has actually improved. Has had UTIs in the past, last episode was several years ago. Has not tried without relief. Denies fever, hematuria, urinary urgency, flank pain, abdominal pain, pelvic pain, genital rash and genital irritation, nausea and vomiting.   [name] has a current medication list which includes the following prescription(s): levetiracetam. Patient has No Known Allergies. [name]  has a past medical history of Seizures (HCC). Also  has a past surgical history that includes Laceration repair (1998).  Objective:   Vitals: BP 126/79   Pulse 71   Temp 97.6 F (36.4 C) (Oral)   Resp 16   Ht 5\' 4"  (1.626 m)   Wt 120 lb 12.8 oz (54.8 kg)   SpO2 99%   BMI 20.74 kg/m   Physical Exam  Constitutional: She is oriented to person, place, and time. She appears well-developed and well-nourished.  Cardiovascular: Normal rate, regular rhythm and intact distal pulses.  Exam reveals no gallop and no friction rub.   No murmur heard. Pulmonary/Chest: No respiratory distress. She has no wheezes. She has no rales.  Abdominal: Soft. Bowel sounds are normal. She exhibits no distension and no mass. There is no tenderness. There is no guarding.  No CVA tenderness.  Musculoskeletal: She exhibits no edema.  Neurological: She is alert and oriented to person, place, and time.  Skin: Skin is warm and dry. No rash noted. No erythema. No pallor.   Results for orders placed or performed in visit on 12/19/16 (from the past 24 hour(s))  POCT urinalysis dipstick     Status: Abnormal   Collection Time: 12/19/16 12:00 PM  Result Value Ref Range   Color, UA yellow yellow   Clarity, UA cloudy (A) clear   Glucose, UA negative negative mg/dL   Bilirubin, UA negative negative   Ketones, POC UA negative negative mg/dL   Spec Grav, UA 3.8751.015 6.4331.010 - 1.025   Blood, UA negative negative   pH, UA 7.0 5.0 - 8.0   Protein Ur, POC negative negative mg/dL   Urobilinogen, UA 0.2 0.2 or 1.0 E.U./dL   Nitrite, UA Negative Negative   Leukocytes, UA Small (1+) (A) Negative  POCT Microscopic Urinalysis ([facility])     Status: Abnormal   Collection Time: 12/19/16 12:07 PM  Result Value Ref Range   WBC,UR,HPF,POC Few (A) None WBC/hpf   RBC,UR,HPF,POC None None RBC/hpf   Bacteria Few (A) None, Too numerous to count   Mucus Absent Absent   Epithelial Cells, UR Per Microscopy Few (A) None, Too numerous to count cells/hpf   Assessment and Plan :   1. Cystitis 2. Dysuria 3. Urinary frequency - Will cover for an uncomplicated UTI with Macrobid. Urine culture is pending. Return-to-clinic precautions discussed, patient verbalized understanding.   Wallis BambergMario Ronan Dion, PA-C [facility] [phone] 12/19/2016 11:57 AM

## 2016-12-19 NOTE — Patient Instructions (Addendum)
Urinary Tract Infection, Adult A urinary tract infection (UTI) is an infection of any part of the urinary tract, which includes the kidneys, ureters, bladder, and urethra. These organs make, store, and get rid of urine in the body. UTI can be a bladder infection (cystitis) or kidney infection (pyelonephritis). What are the causes? This infection may be caused by fungi, viruses, or bacteria. Bacteria are the most common cause of UTIs. This condition can also be caused by repeated incomplete emptying of the bladder during urination. What increases the risk? This condition is more likely to develop if:  You ignore your need to urinate or hold urine for long periods of time.  You do not empty your bladder completely during urination.  You wipe back to front after urinating or having a bowel movement, if you are female.  You are uncircumcised, if you are female.  You are constipated.  You have a urinary catheter that stays in place (indwelling).  You have a weak defense (immune) system.  You have a medical condition that affects your bowels, kidneys, or bladder.  You have diabetes.  You take antibiotic medicines frequently or for long periods of time, and the antibiotics no longer work well against certain types of infections (antibiotic resistance).  You take medicines that irritate your urinary tract.  You are exposed to chemicals that irritate your urinary tract.  You are female. What are the signs or symptoms? Symptoms of this condition include:  Fever.  Frequent urination or passing small amounts of urine frequently.  Needing to urinate urgently.  Pain or burning with urination.  Urine that smells bad or unusual.  Cloudy urine.  Pain in the lower abdomen or back.  Trouble urinating.  Blood in the urine.  Vomiting or being less hungry than normal.  Diarrhea or abdominal pain.  Vaginal discharge, if you are female. How is this diagnosed? This condition is  diagnosed with a medical history and physical exam. You will also need to provide a urine sample to test your urine. Other tests may be done, including:  Blood tests.  Sexually transmitted disease (STD) testing. If you have had more than one UTI, a cystoscopy or imaging studies may be done to determine the cause of the infections. How is this treated? Treatment for this condition often includes a combination of two or more of the following:  Antibiotic medicine.  Other medicines to treat less common causes of UTI.  Over-the-counter medicines to treat pain.  Drinking enough water to stay hydrated. Follow these instructions at home:  Take over-the-counter and prescription medicines only as told by your health care provider.  If you were prescribed an antibiotic, take it as told by your health care provider. Do not stop taking the antibiotic even if you start to feel better.  Avoid alcohol, caffeine, tea, and carbonated beverages. They can irritate your bladder.  Drink enough fluid to keep your urine clear or pale yellow.  Keep all follow-up visits as told by your health care provider. This is important.  Make sure to:  Empty your bladder often and completely. Do not hold urine for long periods of time.  Empty your bladder before and after sex.  Wipe from front to back after a bowel movement if you are female. Use each tissue one time when you wipe. Contact a health care provider if:  You have back pain.  You have a fever.  You feel nauseous or vomit.  Your symptoms do not get better after 3   days.  Your symptoms go away and then return. Get help right away if:  You have severe back pain or lower abdominal pain.  You are vomiting and cannot keep down any medicines or water. This information is not intended to replace advice given to you by your health care provider. Make sure you discuss any questions you have with your health care provider. Document Released:  03/09/2005 Document Revised: 11/11/2015 Document Reviewed: 04/20/2015 Elsevier Interactive Patient Education  2017 Elsevier Inc.     IF you received an x-ray today, you will receive an invoice from [facility]. Please contact [facility] at [phone] with questions or concerns regarding your invoice.   IF you received labwork today, you will receive an invoice from [facility]. Please contact [facility] at [phone] with questions or concerns regarding your invoice.   Our billing staff will not be able to assist you with questions regarding bills from these companies.  You will be contacted with the lab results as soon as they are available. The fastest way to get your results is to activate your My Chart account. Instructions are located on the last page of this paperwork. If you have not heard from us regarding the results in 2 weeks, please contact this office.      

## 2016-12-20 LAB — URINE CULTURE

## 2016-12-26 ENCOUNTER — Ambulatory Visit (INDEPENDENT_AMBULATORY_CARE_PROVIDER_SITE_OTHER): Payer: PRIVATE HEALTH INSURANCE | Admitting: Urgent Care

## 2016-12-26 ENCOUNTER — Encounter: Payer: Self-pay | Admitting: Urgent Care

## 2016-12-26 VITALS — BP 105/68 | HR 74 | Temp 98.8°F | Resp 16 | Ht 64.0 in | Wt 123.0 lb

## 2016-12-26 DIAGNOSIS — B373 Candidiasis of vulva and vagina: Secondary | ICD-10-CM

## 2016-12-26 DIAGNOSIS — N3090 Cystitis, unspecified without hematuria: Secondary | ICD-10-CM

## 2016-12-26 DIAGNOSIS — B3731 Acute candidiasis of vulva and vagina: Secondary | ICD-10-CM

## 2016-12-26 LAB — POCT URINALYSIS DIP (MANUAL ENTRY)
Bilirubin, UA: NEGATIVE
GLUCOSE UA: NEGATIVE mg/dL
Ketones, POC UA: NEGATIVE mg/dL
Leukocytes, UA: NEGATIVE
Nitrite, UA: NEGATIVE
Protein Ur, POC: 30 mg/dL — AB
SPEC GRAV UA: 1.02 (ref 1.010–1.025)
Urobilinogen, UA: 0.2 E.U./dL
pH, UA: 7 (ref 5.0–8.0)

## 2016-12-26 LAB — POC MICROSCOPIC URINALYSIS (UMFC): Mucus: ABSENT

## 2016-12-26 MED ORDER — FLUCONAZOLE 150 MG PO TABS: 150.0000 mg | ORAL_TABLET | ORAL | 0 refills | Status: DC

## 2016-12-26 NOTE — Progress Notes (Signed)
    MRN: [id number] DOB: February 16, 1996  Subjective:   Kaitlyn Kim is a 21 y.o. female presenting for follow up on cystitis. Reports that her symptoms have improved very much but still has occasional and transient stinging sensation. She also has vaginal itching. Her vaginal discharge has resolved. Denies fever, urinary frequency, urinary urgency, genital rashes, n/v, abdominal/pelvic pain, flank pain. She is hydrating very well. She is sexually active and uses condoms for protection. She is declining STI treatment today. Patient is currently on her cycle.  [name] has a current medication list which includes the following prescription(s): levetiracetam. Also has No Known Allergies. [name]  has a past medical history of Seizures (HCC). Also  has a past surgical history that includes Laceration repair (1998).  Objective:   Vitals: BP 105/68   Pulse 74   Temp 98.8 F (37.1 C) (Oral)   Resp 16   Ht 5\' 4"  (1.626 m)   Wt 123 lb (55.8 kg)   LMP 12/24/2016   SpO2 98%   BMI 21.11 kg/m   Physical Exam  Constitutional: She is oriented to person, place, and time. She appears well-developed and well-nourished.  HENT:  Mouth/Throat: Oropharynx is clear and moist.  Cardiovascular: Normal rate, regular rhythm and intact distal pulses.  Exam reveals no gallop and no friction rub.   No murmur heard. Pulmonary/Chest: No respiratory distress. She has no wheezes. She has no rales.  Abdominal: Soft. Bowel sounds are normal. She exhibits no distension and no mass. There is no tenderness. There is no guarding.  No CVA tenderness.  Neurological: She is alert and oriented to person, place, and time.  Skin: Skin is warm and dry.   Results for orders placed or performed in visit on 12/26/16 (from the past 24 hour(s))  POCT urinalysis dipstick     Status: Abnormal   Collection Time: 12/26/16  2:50 PM  Result Value Ref Range   Color, UA yellow yellow   Clarity, UA cloudy (A) clear   Glucose, UA negative  negative mg/dL   Bilirubin, UA negative negative   Ketones, POC UA negative negative mg/dL   Spec Grav, UA 6.5781.020 4.6961.010 - 1.025   Blood, UA large (A) negative   pH, UA 7.0 5.0 - 8.0   Protein Ur, POC =30 (A) negative mg/dL   Urobilinogen, UA 0.2 0.2 or 1.0 E.U./dL   Nitrite, UA Negative Negative   Leukocytes, UA Negative Negative  POCT Microscopic Urinalysis ([facility])     Status: Abnormal   Collection Time: 12/26/16  2:58 PM  Result Value Ref Range   WBC,UR,HPF,POC None None WBC/hpf   RBC,UR,HPF,POC Few (A) None RBC/hpf   Bacteria None None, Too numerous to count   Mucus Absent Absent   Epithelial Cells, UR Per Microscopy Few (A) None, Too numerous to count cells/hpf   Assessment and Plan :   1. Cystitis 2. Yeast vaginitis - Urine culture is pending. Will have patient start diflucan. Patient and I agreed to wait on culture results before deciding on further treatment.  Wallis BambergMario Braylyn Kalter, PA-C [facility] [phone] 12/26/2016 2:51 PM

## 2016-12-26 NOTE — Patient Instructions (Addendum)
Please keep hydrating very well. We will wait for the urine culture results to decided on further treatment. In the meantime, try Diflucan for your current symptoms associated with taking antibiotics.     Urinary Tract Infection, Adult A urinary tract infection (UTI) is an infection of any part of the urinary tract, which includes the kidneys, ureters, bladder, and urethra. These organs make, store, and get rid of urine in the body. UTI can be a bladder infection (cystitis) or kidney infection (pyelonephritis). What are the causes? This infection may be caused by fungi, viruses, or bacteria. Bacteria are the most common cause of UTIs. This condition can also be caused by repeated incomplete emptying of the bladder during urination. What increases the risk? This condition is more likely to develop if:  You ignore your need to urinate or hold urine for long periods of time.  You do not empty your bladder completely during urination.  You wipe back to front after urinating or having a bowel movement, if you are female.  You are uncircumcised, if you are female.  You are constipated.  You have a urinary catheter that stays in place (indwelling).  You have a weak defense (immune) system.  You have a medical condition that affects your bowels, kidneys, or bladder.  You have diabetes.  You take antibiotic medicines frequently or for long periods of time, and the antibiotics no longer work well against certain types of infections (antibiotic resistance).  You take medicines that irritate your urinary tract.  You are exposed to chemicals that irritate your urinary tract.  You are female.  What are the signs or symptoms? Symptoms of this condition include:  Fever.  Frequent urination or passing small amounts of urine frequently.  Needing to urinate urgently.  Pain or burning with urination.  Urine that smells bad or unusual.  Cloudy urine.  Pain in the lower abdomen or  back.  Trouble urinating.  Blood in the urine.  Vomiting or being less hungry than normal.  Diarrhea or abdominal pain.  Vaginal discharge, if you are female.  How is this diagnosed? This condition is diagnosed with a medical history and physical exam. You will also need to provide a urine sample to test your urine. Other tests may be done, including:  Blood tests.  Sexually transmitted disease (STD) testing.  If you have had more than one UTI, a cystoscopy or imaging studies may be done to determine the cause of the infections. How is this treated? Treatment for this condition often includes a combination of two or more of the following:  Antibiotic medicine.  Other medicines to treat less common causes of UTI.  Over-the-counter medicines to treat pain.  Drinking enough water to stay hydrated.  Follow these instructions at home:  Take over-the-counter and prescription medicines only as told by your health care provider.  If you were prescribed an antibiotic, take it as told by your health care provider. Do not stop taking the antibiotic even if you start to feel better.  Avoid alcohol, caffeine, tea, and carbonated beverages. They can irritate your bladder.  Drink enough fluid to keep your urine clear or pale yellow.  Keep all follow-up visits as told by your health care provider. This is important.  Make sure to: ? Empty your bladder often and completely. Do not hold urine for long periods of time. ? Empty your bladder before and after sex. ? Wipe from front to back after a bowel movement if you are female.  Use each tissue one time when you wipe. Contact a health care provider if:  You have back pain.  You have a fever.  You feel nauseous or vomit.  Your symptoms do not get better after 3 days.  Your symptoms go away and then return. Get help right away if:  You have severe back pain or lower abdominal pain.  You are vomiting and cannot keep down any  medicines or water. This information is not intended to replace advice given to you by your health care provider. Make sure you discuss any questions you have with your health care provider. Document Released: 03/09/2005 Document Revised: 11/11/2015 Document Reviewed: 04/20/2015 Elsevier Interactive Patient Education  2017 [employer].     IF you received an x-ray today, you will receive an invoice from [facility]. Please contact [facility] at [phone] with questions or concerns regarding your invoice.   IF you received labwork today, you will receive an invoice from [facility]. Please contact [facility] at [phone] with questions or concerns regarding your invoice.   Our billing staff will not be able to assist you with questions regarding bills from these companies.  You will be contacted with the lab results as soon as they are available. The fastest way to get your results is to activate your My Chart account. Instructions are located on the last page of this paperwork. If you have not heard from [place] regarding the results in 2 weeks, please contact this office.

## 2016-12-27 LAB — URINE CULTURE: ORGANISM ID, BACTERIA: NO GROWTH

## 2016-12-30 ENCOUNTER — Telehealth: Payer: Self-pay | Admitting: Urgent Care

## 2016-12-30 ENCOUNTER — Telehealth: Payer: Self-pay | Admitting: *Deleted

## 2016-12-30 NOTE — Telephone Encounter (Signed)
PT states she came in with UTI, was treated, developed yeast infection, still experiencing burning and frequent urination  >> please do not perscribe same rx as she believes this lead to the yeast infection  [facility] [address]   [phone] please call to advise

## 2016-12-30 NOTE — Telephone Encounter (Signed)
Pt is returning Mani's call.  Please have Urban GibsonMani advise at [phone]

## 2016-12-30 NOTE — Telephone Encounter (Signed)
Left patient a VM. Return-to-clinic precautions reported including fever, n/v, flank pain, abdominal pain. Patient has not been agreeable to STI testing. She may also be undergoing a renal stone. I will look into these diagnoses at f/u visit. Last urine culture from 12/26/2016 was completely negative.

## 2016-12-30 NOTE — Telephone Encounter (Signed)
Per pt was treated 2 wks ago for UTI given Macrobid for 5 days, she completed the course. She is still having burning w/urination, frequent urination. She was advised to sche f/u appt. Pts call transferred to schedulers.

## 2016-12-30 NOTE — Telephone Encounter (Signed)
See previous phone note from Anselm PancoastWanda Robinson, CMA

## 2017-01-02 NOTE — Telephone Encounter (Signed)
Left message to return call 

## 2017-01-04 ENCOUNTER — Ambulatory Visit: Payer: PRIVATE HEALTH INSURANCE | Admitting: Urgent Care

## 2017-01-04 NOTE — Telephone Encounter (Signed)
Pt had appt today that she cancelled.

## 2017-07-27 ENCOUNTER — Ambulatory Visit (INDEPENDENT_AMBULATORY_CARE_PROVIDER_SITE_OTHER): Payer: 59 | Admitting: Urgent Care

## 2017-07-27 VITALS — BP 122/78 | HR 79 | Temp 97.7°F | Resp 16 | Ht 64.0 in | Wt 131.6 lb

## 2017-07-27 DIAGNOSIS — M25561 Pain in right knee: Secondary | ICD-10-CM

## 2017-07-27 MED ORDER — NAPROXEN SODIUM 550 MG PO TABS: 550.0000 mg | ORAL_TABLET | Freq: Two times a day (BID) | ORAL | 1 refills | Status: DC

## 2017-07-27 MED ORDER — CELECOXIB 100 MG PO CAPS: 100.0000 mg | ORAL_CAPSULE | Freq: Two times a day (BID) | ORAL | 1 refills | Status: DC

## 2017-07-27 NOTE — Patient Instructions (Addendum)
If Celebrex turns out to be too expensive, then you can try to fill the script for Anaprox. Do not take these together. Do not use with any other NSAIDs.      Knee Pain, Adult Many things can cause knee pain. The pain often goes away on its own with time and rest. If the pain does not go away, tests may be done to find out what is causing the pain. Follow these instructions at home: Activity  Rest your knee.  Do not do things that cause pain.  Avoid activities where both feet leave the ground at the same time (high-impact activities). Examples are running, jumping rope, and doing jumping jacks. General instructions  Take medicines only as told by your doctor.  Raise (elevate) your knee when you are resting. Make sure your knee is higher than your heart.  Sleep with a pillow under your knee.  If told, put ice on the knee: ? Put ice in a plastic bag. ? Place a towel between your skin and the bag. ? Leave the ice on for 20 minutes, 2-3 times a day.  Ask your doctor if you should wear an elastic knee support.  Lose weight if you are overweight. Being overweight can make your knee hurt more.  Do not use any tobacco products. These include cigarettes, chewing tobacco, or electronic cigarettes. If you need help quitting, ask your doctor. Smoking may slow down healing. Contact a doctor if:  The pain does not stop.  The pain changes or gets worse.  You have a fever along with knee pain.  Your knee gives out or locks up.  Your knee swells, and becomes worse. Get help right away if:  Your knee feels warm.  You cannot move your knee.  You have very bad knee pain.  You have chest pain.  You have trouble breathing. Summary  Many things can cause knee pain. The pain often goes away on its own with time and rest.  Avoid activities that put stress on your knee. These include running and jumping rope.  Get help right away if you cannot move your knee, or if your knee feels  warm, or if you have trouble breathing. This information is not intended to replace advice given to you by your health care provider. Make sure you discuss any questions you have with your health care provider. Document Released: 08/26/2008 Document Revised: 05/24/2016 Document Reviewed: 05/24/2016 Elsevier Interactive Patient Education  2017 [employer]     IF you received an x-ray today, you will receive an invoice from [facility]. Please contact [facility] at [phone] with questions or concerns regarding your invoice.   IF you received labwork today, you will receive an invoice from [facility]. Please contact [facility] at [phone] with questions or concerns regarding your invoice.   Our billing staff will not be able to assist you with questions regarding bills from these companies.  You will be contacted with the lab results as soon as they are available. The fastest way to get your results is to activate your My Chart account. Instructions are located on the last page of this paperwork. If you have not heard from us regarding the results in 2 weeks, please contact this office.

## 2017-07-27 NOTE — Progress Notes (Signed)
  MRN: [id number] DOB: [birth year]  Subjective:   Kaitlyn Kim is a 22 y.o. female presenting for 1 week history of constant right knee pain. Pain is throbbing sharp pain, worse from her work. Has also had stiffness when she has been off her knee. Denies falls, knee buckling, trauma, swelling. Has tried APAP with minimal relief. Props her leg up when she gets home from work. Works at [employer], stands for 9 hour shifts, does 25-35 hours.   [name] has a current medication list which includes the following prescription(s): levetiracetam. Also has No Known Allergies.  [name]  has a past medical history of Seizures (HCC). Also  has a past surgical history that includes Laceration repair (1998).  Objective:   Vitals: BP 122/78   Pulse 79   Temp 97.7 F (36.5 C) (Oral)   Resp 16   Ht 5\' 4"  (1.626 m)   Wt 131 lb 9.6 oz (59.7 kg)   SpO2 99%   BMI 22.59 kg/m   Physical Exam  Constitutional: She is oriented to person, place, and time. She appears well-developed and well-nourished.  Cardiovascular: Normal rate.  Pulmonary/Chest: Effort normal.  Musculoskeletal:       Right knee: She exhibits abnormal patellar mobility (but no dislocation). She exhibits normal range of motion, no swelling, no effusion, no ecchymosis, no deformity, no erythema, normal alignment and no bony tenderness. No tenderness found.       Left knee: She exhibits normal range of motion, no swelling, no effusion, no ecchymosis, no deformity, no laceration, no erythema, normal alignment, normal patellar mobility and no bony tenderness. No tenderness found.  Neurological: She is alert and oriented to person, place, and time.   Assessment and Plan :   Acute pain of right knee  Will start conservative management. Use Celebrex or Anaprox (if the former is too expensive). Work note provided. Return-to-clinic precautions discussed, patient verbalized understanding.   Wallis BambergMario Maryan Sivak, PA-C [facility] [phone] 07/27/2017  4:48 PM

## 2017-07-31 ENCOUNTER — Telehealth: Payer: Self-pay | Admitting: Urgent Care

## 2017-07-31 NOTE — Telephone Encounter (Signed)
Patient needs FMLA forms completed by [facility] for her knee issue. I have completed the forms based off the OV notes and highlighted the area that needs to be completed. I will place the forms in Mani's box on 07/31/17 please return to the FMLA/Disability box at the 102 checkout desk within 5-7 business days. Thank you!

## 2017-08-01 DIAGNOSIS — Z0271 Encounter for disability determination: Secondary | ICD-10-CM

## 2017-08-02 NOTE — Telephone Encounter (Signed)
Paperwork scanned and faxed on 08/02/17

## 2017-08-02 NOTE — Telephone Encounter (Signed)
Signed and placed in fmla box. Thank you!

## 2017-08-03 ENCOUNTER — Ambulatory Visit (INDEPENDENT_AMBULATORY_CARE_PROVIDER_SITE_OTHER): Payer: 59 | Admitting: Urgent Care

## 2017-08-03 ENCOUNTER — Ambulatory Visit (INDEPENDENT_AMBULATORY_CARE_PROVIDER_SITE_OTHER): Payer: 59

## 2017-08-03 VITALS — BP 119/76 | HR 75 | Temp 98.4°F | Resp 18 | Ht 64.0 in | Wt 129.2 lb

## 2017-08-03 DIAGNOSIS — M25561 Pain in right knee: Secondary | ICD-10-CM | POA: Diagnosis not present

## 2017-08-03 DIAGNOSIS — G8929 Other chronic pain: Secondary | ICD-10-CM

## 2017-08-03 DIAGNOSIS — M25562 Pain in left knee: Secondary | ICD-10-CM

## 2017-08-03 NOTE — Progress Notes (Signed)
  MRN: [id number] DOB: [birth year]  Subjective:   Kaitlyn Kim is a 22 y.o. female presenting for persistent right knee pain. At her last OV, patient was seen 07/27/2017, started on Celebrex. Work restrictions were provided. Today, reports that she has used Celebrex, followed work restrictions, is using knee wrap. Feels pain in both her knees. Both hurt equally, is walking with a limp favoring her right knee. Denies trauma, swelling, redness, warmth. Is also having bilateral ankle pain. Admits that she has had knee pain since high school. States that her mother also has really bad knees.   Kaitlyn Kim has a current medication list which includes the following prescription(s): celecoxib and levetiracetam. Also has No Known Allergies.  Kaitlyn Kim  has a past medical history of Seizures (HCC). Also  has a past surgical history that includes Laceration repair (1998). Her family history includes ALS in her maternal grandmother; COPD in her paternal grandfather and paternal grandmother; Cancer in her paternal grandmother; Hyperlipidemia in her mother; Hypertension in her maternal grandmother; Thyroid disease in her mother.   Objective:   Vitals: BP 119/76   Pulse 75   Temp 98.4 F (36.9 C) (Oral)   Resp 18   Ht 5\' 4"  (1.626 m)   Wt 129 lb 3.2 oz (58.6 kg)   SpO2 97%   BMI 22.18 kg/m   Physical Exam  Constitutional: She is oriented to person, place, and time. She appears well-developed and well-nourished.  Cardiovascular: Normal rate.  Pulmonary/Chest: Effort normal.  Musculoskeletal:       Right knee: She exhibits swelling (trace). She exhibits normal range of motion, no effusion, no ecchymosis, no deformity, no laceration, no erythema, normal alignment and normal patellar mobility. Tenderness found. Medial joint line tenderness noted. No lateral joint line, no MCL, no LCL and no patellar tendon tenderness noted.       Left knee: She exhibits normal range of motion, no swelling, no effusion, no  ecchymosis, no deformity, no laceration, no erythema, normal alignment, normal patellar mobility and no bony tenderness. Tenderness found. Medial joint line tenderness noted. No lateral joint line, no MCL and no patellar tendon tenderness noted.  Strength 5/5.  Neurological: She is alert and oriented to person, place, and time. She displays normal reflexes. Coordination (ambulating with limp favoring right knee) abnormal.  Skin: Skin is warm and dry.  Psychiatric: She has a normal mood and affect.   Dg Knee Complete 4 Views Right  Result Date: 08/03/2017 CLINICAL DATA:  Chronic right knee pain. EXAM: RIGHT KNEE - COMPLETE 4+ VIEW COMPARISON:  None. FINDINGS: No evidence of fracture, dislocation, or joint effusion. No evidence of arthropathy or other focal bone abnormality. Soft tissues are unremarkable. IMPRESSION: Normal radiographs. Electronically Signed   By: Paulina FusiMark  Shogry M.D.   On: 08/03/2017 12:47     Assessment and Plan :   Chronic pain of right knee - Plan: DG Knee Complete 4 Views Right, Ambulatory referral to Orthopedic Surgery  Pain in both knees, unspecified chronicity - Plan: Ambulatory referral to Orthopedic Surgery  Will pursue referral to ortho given that she is having to take FMLA and will likely need a faster resolution. Will try 200mg  Celebrex twice daily. Maintain conservative management. Return-to-clinic precautions discussed, patient verbalized understanding.   Wallis BambergMario Konya Fauble, PA-C [facility] [phone] 08/03/2017  12:09 PM

## 2017-08-03 NOTE — Patient Instructions (Addendum)
Try using chondroitin-glucosamine as a joint supplement.   Knee Pain, Adult Knee pain in adults is common. It can be caused by many things, including:  Arthritis.  A fluid-filled sac (cyst) or growth in your knee.  An infection in your knee.  An injury that will not heal.  Damage, swelling, or irritation of the tissues that support your knee.  Knee pain is usually not a sign of a serious problem. The pain may go away on its own with time and rest. If it does not, a health care provider may order tests to find the cause of the pain. These may include:  Imaging tests, such as an X-ray, MRI, or ultrasound.  Joint aspiration. In this test, fluid is removed from the knee.  Arthroscopy. In this test, a lighted tube is inserted into knee and an image is projected onto a TV screen.  A biopsy. In this test, a sample of tissue is removed from the body and studied under a microscope.  Follow these instructions at home: Pay attention to any changes in your symptoms. Take these actions to relieve your pain. Activity  Rest your knee.  Do not do things that cause pain or make pain worse.  Avoid high-impact activities or exercises, such as running, jumping rope, or doing jumping jacks. General instructions  Take over-the-counter and prescription medicines only as told by your health care provider.  Raise (elevate) your knee above the level of your heart when you are sitting or lying down.  Sleep with a pillow under your knee.  If directed, apply ice to the knee: ? Put ice in a plastic bag. ? Place a towel between your skin and the bag. ? Leave the ice on for 20 minutes, 2-3 times a day.  Ask your health care provider if you should wear an elastic knee support.  Lose weight if you are overweight. Extra weight can put pressure on your knee.  Do not use any products that contain nicotine or tobacco, such as cigarettes and e-cigarettes. Smoking may slow the healing of any bone and  joint problems that you may have. If you need help quitting, ask your health care provider. Contact a health care provider if:  Your knee pain continues, changes, or gets worse.  You have a fever along with knee pain.  Your knee buckles or locks up.  Your knee swells, and the swelling becomes worse. Get help right away if:  Your knee feels warm to the touch.  You cannot move your knee.  You have severe pain in your knee.  You have chest pain.  You have trouble breathing. Summary  Knee pain in adults is common. It can be caused by many things, including, arthritis, infection, cysts, or injury.  Knee pain is usually not a sign of a serious problem, but if it does not go away, a health care provider may perform tests to know the cause of the pain.  Pay attention to any changes in your symptoms. Relieve your pain with rest, medicines, light activity, and use of ice.  Get help if your pain continues or becomes very severe, or if your knee buckles or locks up, or if you have chest pain or trouble breathing. This information is not intended to replace advice given to you by your health care provider. Make sure you discuss any questions you have with your health care provider. Document Released: 03/27/2007 Document Revised: 05/20/2016 Document Reviewed: 05/20/2016 Elsevier Interactive Patient Education  [employer].  IF you received an x-ray today, you will receive an invoice from [facility]. Please contact [facility] at [phone] with questions or concerns regarding your invoice.   IF you received labwork today, you will receive an invoice from [facility]. Please contact [facility] at [phone] with questions or concerns regarding your invoice.   Our billing staff will not be able to assist you with questions regarding bills from these companies.  You will be contacted with the lab results as soon as they are available. The fastest way to get your  results is to activate your My Chart account. Instructions are located on the last page of this paperwork. If you have not heard from us regarding the results in 2 weeks, please contact this office.

## 2017-08-07 ENCOUNTER — Encounter: Payer: Self-pay | Admitting: Urgent Care

## 2017-08-15 ENCOUNTER — Telehealth: Payer: Self-pay | Admitting: Urgent Care

## 2017-08-15 NOTE — Telephone Encounter (Signed)
Called Velna HatchetSheila back from [employer] and she wanted to know when was pt able to go back to standard work without restrictions. [facility] and stated that she was still on restrictions until she went to an ortho appt told Velna HatchetSheila that we were sending pt to [facility] and it would approx take 2 weeks for them to get in touch with her for an appt and that they will contact the pt.

## 2017-08-17 ENCOUNTER — Other Ambulatory Visit: Payer: Self-pay | Admitting: Neurology

## 2017-08-17 ENCOUNTER — Encounter: Payer: Self-pay | Admitting: Urgent Care

## 2017-08-18 ENCOUNTER — Encounter: Payer: Self-pay | Admitting: Neurology

## 2017-08-19 ENCOUNTER — Encounter (HOSPITAL_COMMUNITY): Payer: Self-pay | Admitting: Emergency Medicine

## 2017-08-19 ENCOUNTER — Emergency Department (HOSPITAL_COMMUNITY)
Admission: EM | Admit: 2017-08-19 | Discharge: 2017-08-19 | Disposition: A | Discharge status: Home / Self Care | Payer: 59 | Attending: Emergency Medicine | Admitting: Emergency Medicine

## 2017-08-19 DIAGNOSIS — Z79899 Other long term (current) drug therapy: Secondary | ICD-10-CM | POA: Diagnosis not present

## 2017-08-19 DIAGNOSIS — M25561 Pain in right knee: Secondary | ICD-10-CM | POA: Diagnosis present

## 2017-08-19 DIAGNOSIS — M79604 Pain in right leg: Secondary | ICD-10-CM | POA: Diagnosis not present

## 2017-08-19 MED ORDER — IBUPROFEN 600 MG PO TABS: 600.0000 mg | ORAL_TABLET | Freq: Three times a day (TID) | ORAL | 0 refills | Status: DC | PRN

## 2017-08-19 NOTE — ED Provider Notes (Signed)
[facility]-EMERGENCY DEPT Provider Note   CSN: [id number] Arrival date & time: 08/19/17  0730     History   Chief Complaint Chief Complaint  Patient presents with  . Knee Pain  . Leg Pain    HPI Kaitlyn Kim is a 22 y.o. female.  She is complaining of atraumatic right knee pain for over a month that has been getting more severe increased with ambulation and bending.  It is not causing her left knee and her right leg from her buttock to her calf be hurting.  She has been seen in urgent care had negative x-rays.  They recommended NSAIDs and referral on to orthopedist but she has not heard from orthopedist yet.  She has no illness symptoms no fever than he is never been red or swollen or hot.  There is no trauma that she can come up with.  There is no family history of any significant arthropathies.  The history is provided by the patient.  Knee Pain   This is a new problem. Episode onset: 1 month. The problem occurs constantly. The problem has been gradually worsening. The pain is present in the right knee. The pain is moderate. Associated symptoms include stiffness. Pertinent negatives include no numbness, full range of motion, no tingling and no itching. The symptoms are aggravated by activity. She has tried OTC pain medications for the symptoms. The treatment provided mild relief. There has been no history of extremity trauma. Family history is significant for no rheumatoid arthritis and no gout.    Past Medical History:  Diagnosis Date  . Seizures (HCC)    HX of seizures 3 years ago    Patient Active Problem List   Diagnosis Date Noted  . Generalized seizure disorder (HCC) 07/24/2016  . 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 Transient alteration of awareness 09/11/2012    Past Surgical History:  Procedure Laterality Date  . LACERATION REPAIR  1998   forehead    OB History    No data available       Home Medications    Prior to  Admission medications   Medication Sig Start Date End Date Taking? Authorizing Provider  celecoxib (CELEBREX) 100 MG capsule Take 1 capsule (100 mg total) by mouth 2 (two) times daily. 07/27/17   Wallis Bamberg, PA-C  levETIRAcetam (KEPPRA) 500 MG tablet TAKE 1 TABLET BY MOUTH TWICE DAILY 08/17/17   Anson Fret, MD    Family History Family History  Problem Relation Age of Onset  . Hyperlipidemia Mother   . Thyroid disease Mother   . Hypertension Maternal Grandmother   . ALS Maternal Grandmother   . Cancer Paternal Grandmother        lung cancer  . COPD Paternal Grandmother   . COPD Paternal Grandfather   . Seizures Neg Hx     Social History Social History   Tobacco Use  . Smoking status: Never Smoker  . Smokeless tobacco: Never Used  Substance Use Topics  . Alcohol use: Yes    Comment: Patient rarely drinks alcohol.  . Drug use: No     Allergies   Patient has no known allergies.   Review of Systems Review of Systems  Constitutional: Negative for chills and fever.  HENT: Negative for ear pain and sore throat.   Eyes: Negative for pain and visual disturbance.  Respiratory: Negative for cough and shortness of breath.   Cardiovascular: Negative for chest pain and palpitations.  Gastrointestinal: Negative for  abdominal pain and vomiting.  Genitourinary: Negative for dysuria, hematuria and vaginal discharge.  Musculoskeletal: Positive for stiffness. Negative for arthralgias and back pain.  Skin: Negative for color change, itching and rash.  Neurological: Negative for tingling, seizures, syncope and numbness.  All other systems reviewed and are negative.    Physical Exam Updated Vital Signs BP 124/86 (BP Location: Left Arm)   Pulse 86   Temp 98.2 F (36.8 C) (Oral)   Resp 16   LMP 07/27/2017   SpO2 98%   Physical Exam  Constitutional: She appears well-developed and well-nourished.  HENT:  Head: Normocephalic and atraumatic.  Eyes: Conjunctivae are normal.    Neck: Neck supple.  Musculoskeletal:       Right hip: Normal.       Right knee: She exhibits no swelling, no effusion, no deformity, no laceration, no erythema, normal alignment, no LCL laxity, normal patellar mobility, normal meniscus and no MCL laxity. Tenderness found. Medial joint line tenderness noted. No lateral joint line, no MCL, no LCL and no patellar tendon tenderness noted.       Right upper leg: She exhibits tenderness (lateral ileotibial band). She exhibits no swelling and no edema.       Right lower leg: She exhibits tenderness (lateral calf). She exhibits no bony tenderness, no swelling and no edema.  Neurological: She is alert. GCS eye subscore is 4. GCS verbal subscore is 5. GCS motor subscore is 6.  Skin: Skin is warm and dry.  Psychiatric: She has a normal mood and affect.   Gait she has a slight limp with her gait.  ED Treatments / Results  Labs (all labs ordered are listed, but only abnormal results are displayed) Labs Reviewed - No data to display  EKG  EKG Interpretation None       Radiology No results found.  Procedures Procedures (including critical care time)  Medications Ordered in ED Medications - No data to display   Initial Impression / Assessment and Plan / ED Course  I have reviewed the triage vital signs and the nursing notes.  Pertinent labs & imaging results that were available during my care of the patient were reviewed by me and considered in my medical decision making (see chart for details).      Final Clinical Impressions(s) / ED Diagnoses   Final diagnoses:  Acute pain of right knee    ED Discharge Orders        Ordered    ibuprofen (ADVIL,MOTRIN) 600 MG tablet  Every 8 hours PRN     08/19/17 0816       Terrilee FilesButler, Michael C, MD 08/19/17 Barry Brunner1935

## 2017-08-19 NOTE — Discharge Instructions (Signed)
You were evaluated in the emergency department for right knee pain that is now affecting your thigh and hip with ambulation.  The exact cause of your pain is not clear.  We are prescribing an anti-inflammatory to help with the pain and I do not feel that this should affect any of your seizure medications.  Please review this with your primary care doctor.  I am also giving you the number for the orthopedic clinic and you should call them on Monday to be seen.

## 2017-08-19 NOTE — ED Triage Notes (Signed)
Patient c/o bilat knee pain that has been ongoing. The pain on right leg pain radiates up from keen to right lower back. Waiting to see ortho.

## 2017-08-21 ENCOUNTER — Encounter: Payer: Self-pay | Admitting: Urgent Care

## 2017-08-21 ENCOUNTER — Ambulatory Visit (INDEPENDENT_AMBULATORY_CARE_PROVIDER_SITE_OTHER): Payer: 59 | Admitting: Orthopaedic Surgery

## 2017-08-21 DIAGNOSIS — M25551 Pain in right hip: Secondary | ICD-10-CM

## 2017-08-21 MED ORDER — MELOXICAM 7.5 MG PO TABS: 7.5000 mg | ORAL_TABLET | Freq: Two times a day (BID) | ORAL | 2 refills | Status: DC | PRN

## 2017-08-21 MED ORDER — PREDNISONE 10 MG (21) PO TBPK
ORAL_TABLET | ORAL | 0 refills | Status: DC
Start: 2017-08-21 — End: 2017-09-14

## 2017-08-21 NOTE — Progress Notes (Signed)
Office Visit Note   Patient: Kaitlyn Kim           Date of Birth: September 11, 1995           MRN: [id number] Visit Date: 08/21/2017              Requested by: Eliberto Ivorylark, William, MD [address], [address] [facility] [place] [id number] PCP: Eliberto Ivorylark, William, MD   Assessment & Plan: Visit Diagnoses:  1. Pain of right hip joint     Plan: Impression in his right lower extremity radiculopathy versus right SI joint dysfunction.  Cannot see any objective findings on her right knee.  I think this may be referred pain from her back or hip.  I recommend modified work and physical therapy.  Prescription for meloxicam and prednisone.  Questions encouraged and answered.  If not better patient instructed to follow-up.  Follow-Up Instructions: Return if symptoms worsen or fail to improve.   Orders:  No orders of the defined types were placed in this encounter.  Meds ordered this encounter  Medications  . predniSONE (STERAPRED UNI-PAK 21 TAB) 10 MG (21) TBPK tablet    Sig: Take as directed    Dispense:  21 tablet    Refill:  0  . meloxicam (MOBIC) 7.5 MG tablet    Sig: Take 1 tablet (7.5 mg total) by mouth 2 (two) times daily as needed for pain.    Dispense:  30 tablet    Refill:  2      Procedures: No procedures performed   Clinical Data: No additional findings.   Subjective: Chief Complaint  Patient presents with  . Right Knee - Pain    Kaitlyn Kim is a 22 year old female who comes in with right knee pain that radiates from the hip and back with a burning sensation is worse with activity.  This  has been going on for about a month.  She denies any recent injuries or surgeries.  She takes ibuprofen with partial relief.  Denies any numbness and tingling.    Review of Systems  Constitutional: Negative.   HENT: Negative.   Eyes: Negative.   Respiratory: Negative.   Cardiovascular: Negative.   Endocrine: Negative.   Musculoskeletal: Negative.     Neurological: Negative.   Hematological: Negative.   Psychiatric/Behavioral: Negative.   All other systems reviewed and are negative.    Objective: Vital Signs: LMP 07/27/2017   Physical Exam  Constitutional: She is oriented to person, place, and time. She appears well-developed and well-nourished.  HENT:  Head: Normocephalic and atraumatic.  Eyes: EOM are normal.  Neck: Neck supple.  Pulmonary/Chest: Effort normal.  Abdominal: Soft.  Neurological: She is alert and oriented to person, place, and time.  Skin: Skin is warm. Capillary refill takes less than 2 seconds.  Psychiatric: She has a normal mood and affect. Her behavior is normal. Judgment and thought content normal.  Nursing note and vitals reviewed.   Ortho Exam Right knee exam shows no joint effusion.  Collaterals and cruciates are stable.  Normal range of motion.  1+ patellofemoral crepitus.  Patella tracking normal.  Right hip exam shows an equivocal Faber test.  SI joint is slightly tender to palpation.  Negative straight leg.  Trochanteric bursa is mildly tender.  Normal hip range of motion.  Negative Stinchfield. Specialty Comments:  No specialty comments available.  Imaging: No results found.   PMFS History: Patient Active Problem List   Diagnosis Date Noted  . Generalized seizure  disorder (HCC) 07/24/2016  . 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 Transient alteration of awareness 09/11/2012   Past Medical History:  Diagnosis Date  . Seizures (HCC)    HX of seizures 3 years ago    Family History  Problem Relation Age of Onset  . Hyperlipidemia Mother   . Thyroid disease Mother   . Hypertension Maternal Grandmother   . ALS Maternal Grandmother   . Cancer Paternal Grandmother        lung cancer  . COPD Paternal Grandmother   . COPD Paternal Grandfather   . Seizures Neg Hx     Past Surgical History:  Procedure Laterality Date  . LACERATION REPAIR  1998   forehead   Social  History   Occupational History  . 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 Occupation: [employer]  Tobacco Use  . Smoking status: Never Smoker  . Smokeless tobacco: Never Used  Substance and Sexual Activity  . Alcohol use: Yes    Comment: Patient rarely drinks alcohol.  . Drug use: No  . Sexual activity: No

## 2017-08-27 ENCOUNTER — Encounter (INDEPENDENT_AMBULATORY_CARE_PROVIDER_SITE_OTHER): Payer: Self-pay | Admitting: Orthopaedic Surgery

## 2017-08-29 ENCOUNTER — Telehealth: Payer: Self-pay | Admitting: Urgent Care

## 2017-08-29 NOTE — Telephone Encounter (Signed)
Signed and placed in FMLA box. 

## 2017-08-29 NOTE — Telephone Encounter (Signed)
See response.  90 day leave is fine

## 2017-08-29 NOTE — Telephone Encounter (Signed)
Patient needs forms updated for her disability/ time off due to her knee pain. I have completed the forms based off the notes in the computer they just need to be signed. I will place the forms in Mani's box on 08/29/17 please return to the FMLA/Disability box at the [employer] checkout desk within 5-7 business days. Thank you!

## 2017-09-14 ENCOUNTER — Ambulatory Visit (INDEPENDENT_AMBULATORY_CARE_PROVIDER_SITE_OTHER): Payer: 59 | Admitting: Urgent Care

## 2017-09-14 ENCOUNTER — Encounter: Payer: Self-pay | Admitting: Urgent Care

## 2017-09-14 VITALS — BP 121/76 | HR 67 | Temp 97.8°F | Resp 16 | Ht 64.0 in | Wt 130.2 lb

## 2017-09-14 DIAGNOSIS — R300 Dysuria: Secondary | ICD-10-CM

## 2017-09-14 DIAGNOSIS — R293 Abnormal posture: Secondary | ICD-10-CM | POA: Diagnosis not present

## 2017-09-14 DIAGNOSIS — M2560 Stiffness of unspecified joint, not elsewhere classified: Secondary | ICD-10-CM | POA: Diagnosis not present

## 2017-09-14 DIAGNOSIS — M545 Low back pain: Secondary | ICD-10-CM | POA: Diagnosis not present

## 2017-09-14 DIAGNOSIS — R350 Frequency of micturition: Secondary | ICD-10-CM

## 2017-09-14 DIAGNOSIS — N3090 Cystitis, unspecified without hematuria: Secondary | ICD-10-CM

## 2017-09-14 LAB — POC MICROSCOPIC URINALYSIS (UMFC): Mucus: ABSENT

## 2017-09-14 LAB — POCT URINALYSIS DIP (MANUAL ENTRY)
BILIRUBIN UA: NEGATIVE
GLUCOSE UA: NEGATIVE mg/dL
NITRITE UA: NEGATIVE
Spec Grav, UA: 1.025 (ref 1.010–1.025)
UROBILINOGEN UA: 0.2 U/dL
pH, UA: 6 (ref 5.0–8.0)

## 2017-09-14 MED ORDER — SULFAMETHOXAZOLE-TRIMETHOPRIM 800-160 MG PO TABS: 1.0000 | ORAL_TABLET | Freq: Two times a day (BID) | ORAL | 0 refills | Status: DC

## 2017-09-14 MED ORDER — FLUCONAZOLE 150 MG PO TABS: 150.0000 mg | ORAL_TABLET | ORAL | 0 refills | Status: DC

## 2017-09-14 MED ORDER — CYCLOBENZAPRINE HCL 5 MG PO TABS: 5.0000 mg | ORAL_TABLET | Freq: Every day | ORAL | 1 refills | Status: DC

## 2017-09-14 NOTE — Progress Notes (Signed)
    MRN: [id number] DOB: 01/03/1996  Subjective:   Kaitlyn Kim is a 22 y.o. female presenting for 2 day history of dysuria, urinary frequency and cloudy malordorous urine. Has not tried any medications for relief. Denies fever, hematuria, urinary urgency, flank pain, abdominal pain, pelvic pain, genital rash and vaginal discharge, nausea and vomiting. Expects her cycle in ~1 week, is regular. She does not want pregnancy test today. She has not hydrated well, drinks a lot of coffee.  Kaitlyn Kim has a current medication list which includes the following prescription(s): levetiracetam. Patient has No Known Allergies. Kaitlyn Kim  has a past medical history of Seizures (HCC). Also  has a past surgical history that includes Laceration repair (1998).  Objective:   Vitals: BP 121/76   Pulse 67   Temp 97.8 F (36.6 C) (Oral)   Resp 16   Ht 5\' 4"  (1.626 m)   Wt 130 lb 3.2 oz (59.1 kg)   SpO2 98%   BMI 22.35 kg/m   Physical Exam  Constitutional: She is oriented to person, place, and time. She appears well-developed and well-nourished.  HENT:  Mouth/Throat: Oropharynx is clear and moist.  Eyes: Right eye exhibits no discharge. Left eye exhibits no discharge. No scleral icterus.  Cardiovascular: Normal rate, regular rhythm and intact distal pulses. Exam reveals no gallop and no friction rub.  No murmur heard. Pulmonary/Chest: No respiratory distress. She has no wheezes. She has no rales.  Abdominal: Soft. Bowel sounds are normal. She exhibits no distension and no mass. There is no tenderness.  No CVA tenderness.  Musculoskeletal: She exhibits no edema.  Neurological: She is alert and oriented to person, place, and time.  Skin: Skin is warm and dry. No rash noted. No erythema. No pallor.    Results for orders placed or performed in visit on 09/14/17 (from the past 24 hour(s))  POCT urinalysis dipstick     Status: Abnormal   Collection Time: 09/14/17 11:14 AM  Result Value Ref Range   Color, UA  yellow yellow   Clarity, UA cloudy (A) clear   Glucose, UA negative negative mg/dL   Bilirubin, UA negative negative   Ketones, POC UA trace (5) (A) negative mg/dL   Spec Grav, UA 4.0981.025 1.1911.010 - 1.025   Blood, UA large (A) negative   pH, UA 6.0 5.0 - 8.0   Protein Ur, POC trace (A) negative mg/dL   Urobilinogen, UA 0.2 0.2 or 1.0 E.U./dL   Nitrite, UA Negative Negative   Leukocytes, UA Small (1+) (A) Negative  POCT Microscopic Urinalysis ([facility])     Status: Abnormal   Collection Time: 09/14/17 11:20 AM  Result Value Ref Range   WBC,UR,HPF,POC Too numerous to count  (A) None WBC/hpf   RBC,UR,HPF,POC Moderate (A) None RBC/hpf   Bacteria Many (A) None, Too numerous to count   Mucus Absent Absent   Epithelial Cells, UR Per Microscopy Few (A) None, Too numerous to count cells/hpf   Assessment and Plan :   Cystitis  Dysuria - Plan: POCT Microscopic Urinalysis ([facility]), POCT urinalysis dipstick, Urine Culture  Urinary frequency  Will start Bactrim, use diflucan for antibiotic associated yeast infection. Urine culture is pending. At the end of her visit, patient reported that she has recurrent back pain and spasms. Offered her script for Flexeril. She is also pursuing physical therapy for this. Return-to-clinic precautions discussed, patient verbalized understanding.   Wallis BambergMario Sparkle Aube, PA-C [facility] [phone] 09/14/2017 11:15 AM

## 2017-09-14 NOTE — Patient Instructions (Addendum)
Hydrate well with at least 2 liters (1 gallon) of water daily.    Dysuria Dysuria is pain or discomfort while urinating. The pain or discomfort may be felt in the tube that carries urine out of the bladder (urethra) or in the surrounding tissue of the genitals. The pain may also be felt in the groin area, lower abdomen, and lower back. You may have to urinate frequently or have the sudden feeling that you have to urinate (urgency). Dysuria can affect both men and women, but is more common in women. Dysuria can be caused by many different things, including:  Urinary tract infection in women.  Infection of the kidney or bladder.  Kidney stones or bladder stones.  Certain sexually transmitted infections (STIs), such as chlamydia.  Dehydration.  Inflammation of the vagina.  Use of certain medicines.  Use of certain soaps or scented products that cause irritation.  Follow these instructions at home: Watch your dysuria for any changes. The following actions may help to reduce any discomfort you are feeling:  Drink enough fluid to keep your urine clear or pale yellow.  Empty your bladder often. Avoid holding urine for long periods of time.  After a bowel movement or urination, women should cleanse from front to back, using each tissue only once.  Empty your bladder after sexual intercourse.  Take medicines only as directed by your health care provider.  If you were prescribed an antibiotic medicine, finish it all even if you start to feel better.  Avoid caffeine, tea, and alcohol. They can irritate the bladder and make dysuria worse. In men, alcohol may irritate the prostate.  Keep all follow-up visits as directed by your health care provider. This is important.  If you had any tests done to find the cause of dysuria, it is your responsibility to obtain your test results. Ask the lab or department performing the test when and how you will get your results. Talk with your health  care provider if you have any questions about your results.  Contact a health care provider if:  You develop pain in your back or sides.  You have a fever.  You have nausea or vomiting.  You have blood in your urine.  You are not urinating as often as you usually do. Get help right away if:  You pain is severe and not relieved with medicines.  You are unable to hold down any fluids.  You or someone else notices a change in your mental function.  You have a rapid heartbeat at rest.  You have shaking or chills.  You feel extremely weak. This information is not intended to replace advice given to you by your health care provider. Make sure you discuss any questions you have with your health care provider. Document Released: 02/26/2004 Document Revised: 11/05/2015 Document Reviewed: 01/23/2014 Elsevier Interactive Patient Education  2018 [employer]     IF you received an x-ray today, you will receive an invoice from [facility]. Please contact [facility] at [phone] with questions or concerns regarding your invoice.   IF you received labwork today, you will receive an invoice from [facility]. Please contact [facility] at [phone] with questions or concerns regarding your invoice.   Our billing staff will not be able to assist you with questions regarding bills from these companies.  You will be contacted with the lab results as soon as they are available. The fastest way to get your results is to activate your My Chart account. Instructions  are located on the last page of this paperwork. If you have not heard from [place] regarding the results in 2 weeks, please contact this office.

## 2017-09-16 LAB — URINE CULTURE

## 2017-09-19 DIAGNOSIS — M545 Low back pain: Secondary | ICD-10-CM | POA: Diagnosis not present

## 2017-09-19 DIAGNOSIS — R293 Abnormal posture: Secondary | ICD-10-CM | POA: Diagnosis not present

## 2017-09-19 DIAGNOSIS — M2560 Stiffness of unspecified joint, not elsewhere classified: Secondary | ICD-10-CM | POA: Diagnosis not present

## 2017-11-08 ENCOUNTER — Ambulatory Visit: Payer: 59 | Admitting: Neurology

## 2017-11-08 ENCOUNTER — Encounter: Payer: Self-pay | Admitting: Neurology

## 2017-11-08 VITALS — BP 106/74 | Ht 64.0 in | Wt 130.8 lb

## 2017-11-08 DIAGNOSIS — G40B09 Juvenile myoclonic epilepsy, not intractable, without status epilepticus: Secondary | ICD-10-CM | POA: Diagnosis not present

## 2017-11-08 MED ORDER — LEVETIRACETAM 500 MG PO TABS: 500.0000 mg | ORAL_TABLET | Freq: Two times a day (BID) | ORAL | 6 refills | Status: DC

## 2017-11-08 NOTE — Progress Notes (Signed)
[facility]    Provider:  Dr Lucia Gaskins Referring Provider: Eliberto Ivory, MD Primary Care Physician:  Eliberto Ivory, MD  CC:  seizures  Interval history 11/08/2017: Very stable, no issues, no seizures, good compliance, doing great. She works at [employer].   Interval history 11/08/2016: Patient returns today for follow-up of seizures since age of 40. She has a history of Seizures and Generalized Tonic-Clonic Seizures. She Was Started on Keppra. Juvenile Myoclonic Epilepsy Is in the Differential. I Last Appointment We Continued Her on Keppra, Discussed Lamictal Is a Good Alternative. She is under stress. Doing well on Keppra no more mood swings. No seizures or staring spells, no jerking movements. We discussed Pradhan staying on Keppra. If she ever has any side effects to the Keppra or changes in mood we can switch to Lamictal. She reports some memory changes which are not uncommon on epilepsy medications we will monitor clinically.  HPI:  Kaitlyn Kim is a 22 y.o. female here as a referral from Dr. Chestine Spore for seizures. Patient has a past medical history of late onset absence seizures since the age of 17. She presented to [facility] February, earlier this month, not on any antiepileptics he medications after having episodes of seizures. She started making grunting noises followed by 20-30 seconds of tonic-clonic seizure. He was postictal afterwards. She appeared to bite her tongue and was having trouble breathing. Patient had been having morning seizures for over a month. Seizures started her at 40-19. She had been on Lamictal in the past with side effects. She was switched to Keppra have been off this medication for several years. She did see Dr. Sharene Skeans in the past.She was restarted on Keppra. She is here with parents who also provide information. She had a GTCS in 2015, then January 2017 and then [facility] and in December started increasing in frequency. She screams, head back, eyes  wide open, goes on for 1-2 minutes. She had 3 in a row and went to the ED. She is irritable on keppra.   Reviewed notes, labs and imaging from outside physicians, which showed:  Reviewed notes from pediatric neurology.Patient was diagnosed with SEIZURES AT THE AGE OF 11. She was trialed on multiple medications with side effects including Keppra caused aggressiveness, ethosuximide GI upset and panic, lamotrigine was also poorly tolerated.The patient had onset of seizures in 2008. EEG in January 18, 2008, showed spike and slow wave discharges of 3 Hz and bitemporal slowing. The patient had EEG on June 10, 2011, and on the basis of that, decision was made to taper and discontinue levetiracetam.    Personally reviewed MRI images and agree with the following:  FINDINGS: Brain: Cerebral volume is normal for patient age. No focal parenchymal signal abnormality identified. No abnormal foci of restricted diffusion to suggest acute or subacute ischemia. Gray-white matter differentiation well maintained. No areas of encephalomalacia to suggest chronic infarction. No evidence for acute or chronic intracranial hemorrhage.  No mass lesion, midline shift, or mass effect. Cortical sulcation is normal. Ventricles normal in size without evidence for hydrocephalus. No extra-axial fluid collection. Major dural sinuses are grossly patent. No abnormal enhancement.  Pituitary gland and suprasellar region within normal limits. Midline structures intact and normal.  Vascular: Major intracranial vascular flow voids are well maintained.  Skull and upper cervical spine: Craniocervical junction normal. Visualized upper cervical spine unremarkable. Bone marrow signal intensity within normal limits. No scalp soft tissue abnormality.  Sinuses/Orbits: Globes and orbital soft tissues within normal limits.  Paranasal  sinuses are clear. No mastoid effusion. Inner ear structures  normal.  IMPRESSION: Normal MRI of the brain.  EEG was normal. Reviewed report: Impression: This awake and asleepEEG is normal.   Clinical Correlation: A normal EEG does not exclude a clinical diagnosis of epilepsy. If further clinical questions remain, prolonged EEG may be helpful. Clinical correlation is advised.  Urine drug screen was negative, BMP normal, CBC with mild anemia 07/25/2016.  Review of Systems: Patient complains of symptoms per HPI as well as the following symptoms: no CP, no SOB. Pertinent negatives per HPI. All others negative.    Social History   Socioeconomic History  . Marital status: Single    Spouse name: n/a  . Number of children: 0  . Years of education: 60  . Highest education level: Not on file  Occupational History  . 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 Occupation: [employer]  . Financial resource strain: Not on file  . Food insecurity:    Worry: Not on file    Inability: Not on file  . Transportation needs:    Medical: Not on file    Non-medical: Not on file  Tobacco Use  . Smoking status: Never Smoker  . Smokeless tobacco: Never Used  Substance and Sexual Activity  . Alcohol use: Yes    Comment: Patient rarely drinks alcohol.  . Drug use: No  . Sexual activity: Never  Lifestyle  . Physical activity:    Days per week: Not on file    Minutes per session: Not on file  . Stress: Not on file  Relationships  . Social connections:    Talks on phone: Not on file    Gets together: Not on file    Attends religious service: Not on file    Active member of club or organization: Not on file    Attends meetings of clubs or organizations: Not on file    Relationship status: Not on file  . Intimate partner violence:    Fear of current or ex partner: Not on file    Emotionally abused: Not on file    Physically abused: Not on file    Forced sexual activity: Not on file  Other Topics  Concern  . 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 Hyperlipidemia Mother   . Thyroid disease Mother   . Hypertension Maternal Grandmother   . ALS Maternal Grandmother   . Cancer Paternal Grandmother        lung cancer  . COPD Paternal Grandmother   . COPD Paternal Grandfather   . Seizures Neg Hx     Past Medical History:  Diagnosis Date  . Seizures (HCC)    HX of seizures 3 years ago    Past Surgical History:  Procedure Laterality Date  . LACERATION REPAIR  1998   forehead    Current Outpatient Medications  Medication Sig Dispense Refill  . levETIRAcetam (KEPPRA) 500 MG tablet Take 1 tablet (500 mg total) by mouth 2 (two) times daily. 180 tablet 6   No current facility-administered medications for this visit.     Allergies as of 11/08/2017  . (No Known Allergies)    Vitals: BP 106/74   Ht  (1.626 m)   Wt  130 lb 12.8 oz (59.3 kg)   BMI 22.45 kg/m  Last Weight:  Wt Readings from Last 1 Encounters:  11/08/17 130 lb 12.8 oz (59.3 kg)   Last Height:   Ht Readings from Last 1 Encounters:  11/08/17  (1.626 m)   Physical exam: Exam: Gen: NAD, conversant, well nourised, obese, well groomed                     CV: RRR, no MRG. No Carotid Bruits. No peripheral edema, warm, nontender Eyes: Conjunctivae clear without exudates or hemorrhage  Neuro: Detailed Neurologic Exam  Speech:    Speech is normal; fluent and spontaneous with normal comprehension.  Cognition:    The patient is oriented to person, place, and time;     recent and remote memory intact;     language fluent;     normal attention, concentration,     fund of knowledge Cranial Nerves:    The pupils are equal, round, and reactive to light. The fundi are normal and spontaneous venous pulsations are present. Visual fields are full to  finger confrontation. Extraocular movements are intact. Trigeminal sensation is intact and the muscles of mastication are normal. The face is symmetric. The palate elevates in the midline. Hearing intact. Voice is normal. Shoulder shrug is normal. The tongue has normal motion without fasciculations.   Coordination:    Normal finger to nose and heel to shin. Normal rapid alternating movements.   Gait:    Heel-toe and tandem gait are normal.   Motor Observation:    No asymmetry, no atrophy, and no involuntary movements noted. Tone:    Normal muscle tone.    Posture:    Posture is normal. normal erect    Strength:    Strength is V/V in the upper and lower limbs.      Sensation: intact to LT     Reflex Exam:  DTR's:    Deep tendon reflexes in the upper and lower extremities are normal bilaterally.   Toes:    The toes are downgoing bilaterally.   Clonus:    Clonus is absent.   Assessment/Plan:  22 year old with history of Absence seizures Dxed at the age of 65 with GTCS over the years and recently worsened. Started on Keppra in the ED. Question JME. She wants to continue on Keppra, Lamictal is a good alternative.  No seizures, she is stable and doing well.  Last seizure was in February 2018. At this time no restrictions.  Discussed Patients with epilepsy have a small risk of sudden unexpected death, a condition referred to as sudden unexpected death in epilepsy (SUDEP). SUDEP is defined specifically as the sudden, unexpected, witnessed or unwitnessed, nontraumatic and nondrowning death in patients with epilepsy with or without evidence for a seizure, and excluding documented status epilepticus, in which post mortem examination does not reveal a structural or toxicologic cause for death   Cc: Dr. Rondall Allegra, MD  [facility] [address] [place], [place] [address]  Phone [phone] Fax [phone]  A total of 15 minutes was  spent face-to-face with this patient. Over half this time was spent on counseling patient on the JME diagnosis and different diagnostic and therapeutic options available.

## 2018-10-06 ENCOUNTER — Other Ambulatory Visit: Payer: Self-pay | Admitting: Neurology

## 2018-10-06 MED ORDER — LEVETIRACETAM 500 MG PO TABS: 500.0000 mg | ORAL_TABLET | Freq: Two times a day (BID) | ORAL | 4 refills | Status: DC

## 2018-11-08 ENCOUNTER — Telehealth: Payer: 59 | Admitting: Physician Assistant

## 2018-11-08 DIAGNOSIS — R300 Dysuria: Secondary | ICD-10-CM | POA: Diagnosis not present

## 2018-11-08 MED ORDER — CEPHALEXIN 500 MG PO CAPS: 500.0000 mg | ORAL_CAPSULE | Freq: Two times a day (BID) | ORAL | 0 refills | Status: AC

## 2018-11-08 NOTE — Progress Notes (Signed)

## 2018-11-08 NOTE — Progress Notes (Signed)
I have spent 5 minutes in review of e-visit questionnaire, review and updating patient chart, medical decision making and response to patient.   Mia Milan Cody Jacklynn Dehaas, PA-C    

## 2018-11-12 ENCOUNTER — Ambulatory Visit: Payer: 59 | Admitting: Neurology

## 2019-03-05 ENCOUNTER — Telehealth: Payer: Self-pay | Admitting: Neurology

## 2019-03-05 NOTE — Telephone Encounter (Signed)
Spoke with [facility]. Keppra refill ready x 3 days. Spoke with pt and informed her of refill being ready and advised Dr. Jaynee Eagles had prescribed enough to last through March/April 2021. Pt scheduled f/u with Amy for [facility] 08/26/2019 @ 8:30 am arrival 8:00. Pt verbalized appreciation for the call.

## 2019-03-05 NOTE — Telephone Encounter (Signed)
Pt is needing a refill on her levETIRAcetam (KEPPRA) 500 MG tablet sent to the [facility] on [address]

## 2019-05-20 ENCOUNTER — Telehealth: Payer: 59 | Admitting: Emergency Medicine

## 2019-05-20 DIAGNOSIS — R140 Abdominal distension (gaseous): Secondary | ICD-10-CM

## 2019-05-20 DIAGNOSIS — R197 Diarrhea, unspecified: Secondary | ICD-10-CM | POA: Diagnosis not present

## 2019-05-20 MED ORDER — DICYCLOMINE HCL 20 MG PO TABS: 20.0000 mg | ORAL_TABLET | Freq: Four times a day (QID) | ORAL | 0 refills | Status: DC

## 2019-05-20 NOTE — Progress Notes (Signed)
Time spent: 10 min  Ms Kaitlyn Kim,  We are sorry that you are not feeling well.  Here is how we plan to help!  Based on what you have shared with me it looks like you have Acute Infectious Diarrhea.   Most cases of acute diarrhea are due to infections due to a virus and less frequently a bacteria.  Either viral or bacterial, most cases of diarrhea are self-limited conditions but can last up to than 14 days and commonly cause low grade fever, decreased appetite, abdominal cramping, gas and bloating.  Antibiotics are not needed for most people with diarrhea.  Pepto can classically cause black colored stools because of the medicine in it.  This usually resolves after a few days of taking the medicine.  Pepto can also help facilitate a stool and help with constipation and this is likely what cause a little bit of diarrhea.  Imodium is a great medicine to help stop diarrhea but can cause bloating.    I recommend symptomatic treatment for now with diet modifications and the following:  HOME CARE  We recommend changing your diet to help with your symptoms for the next few days.  Drink plenty of fluids that contain electrolytes. Sports drinks such as Gatorade, Pedialyte, vitamin water may help.   Modify your diet and avoid things that will worsen diarrhea and bloating such as greasy fatty meals, acidic foods, dairy products, alcohol, caffeine, fruits and vegetables  You may try broths, soups and bland foods  You are considered infectious for as long as the diarrhea continues. Hand washing or use of alcohol based hand sanitizers is recommend.  It is best to stay out of work or school until your symptoms stop.   SIMETHICONE (Gas-X) 125 mg for gas and bloating  Use [phone] mg ACETAMINOPHEN (tylenol) every 6-8 hours for cramping and abdominal pain.  Avoid ibuprofen as this can cause stomach irritation.   I have prescribed BENTYL which is a medicine that help with spasms of the gut.  This can  help with cramping related to diarrhea.    GET HELP RIGHT AWAY  If you have dark yellow colored urine or do not pass urine frequently, this could mean you are dehydrated.    If your symptoms worsen   If you feel like you are going to pass out (faint)  If you have localized abdominal pain in the right upper or right lower abdomen, or left lower abdomen only  If you develop a fever greater than 100.4 F, persistent vomiting, red or black, coffee ground type stools  You have a new problem  MAKE SURE YOU   Understand these instructions.  Will watch your condition.  Will get help right away if you are not doing well or get worse.  Your e-visit answers were reviewed by a board certified advanced clinical practitioner to complete your personal care plan.  Depending on the condition, your plan could have included both over the counter or prescription medications.  If there is a problem please reply  once you have received a response from your provider.  Your safety is important to [place].  If you have drug allergies check your prescription carefully.    You can use MyChart to ask questions about today's visit, request a non-urgent call back, or ask for a work or school excuse for 24 hours related to this e-Visit. If it has been greater than 24 hours you will need to follow up with your provider, or enter  a new e-Visit to address those concerns.   You will get an e-mail in the next two days asking about your experience.  I hope that your e-visit has been valuable and will speed your recovery. Thank you for using e-visits.  I hope you feel better!  Carmon Sails, PA-C

## 2019-07-04 ENCOUNTER — Telehealth: Payer: 59 | Admitting: Physician Assistant

## 2019-07-04 DIAGNOSIS — B9689 Other specified bacterial agents as the cause of diseases classified elsewhere: Secondary | ICD-10-CM

## 2019-07-04 DIAGNOSIS — J028 Acute pharyngitis due to other specified organisms: Secondary | ICD-10-CM

## 2019-07-04 MED ORDER — AMOXICILLIN 500 MG PO CAPS: 500.0000 mg | ORAL_CAPSULE | Freq: Three times a day (TID) | ORAL | 0 refills | Status: DC

## 2019-07-04 NOTE — Progress Notes (Signed)
We are sorry that you are not feeling well.  Here is how we plan to help!  Based on what you have shared with me it is likely that you have strep pharyngitis.  Strep pharyngitis is inflammation and infection in the back of the throat.  This is an infection cause by bacteria and is treated with antibiotics.  I have prescribed Amoxicillin 500 mg twice a day for 10 days. For throat pain, we recommend over the counter oral pain relief medications such as acetaminophen or aspirin, or anti-inflammatory medications such as ibuprofen or naproxen sodium. Topical treatments such as oral throat lozenges or sprays may be used as needed. Strep infections are not as easily transmitted as other respiratory infections, however we still recommend that you avoid close contact with loved ones, especially the very young and elderly.  Remember to wash your hands thoroughly throughout the day as this is the number one way to prevent the spread of infection and wipe down door knobs and counters with disinfectant.   Home Care:  Only take medications as instructed by your medical team.  Complete the entire course of an antibiotic.  Do not take these medications with alcohol.  A steam or ultrasonic humidifier can help congestion.  You can place a towel over your head and breathe in the steam from hot water coming from a faucet.  Avoid close contacts especially the very young and the elderly.  Cover your mouth when you cough or sneeze.  Always remember to wash your hands.  Get Help Right Away If:  You develop worsening fever or sinus pain.  You develop a severe head ache or visual changes.  Your symptoms persist after you have completed your treatment plan.  Make sure you  Understand these instructions.  Will watch your condition.  Will get help right away if you are not doing well or get worse.  Your e-visit answers were reviewed by a board certified advanced clinical practitioner to complete your  personal care plan.  Depending on the condition, your plan could have included both over the counter or prescription medications.  If there is a problem please reply  once you have received a response from your provider.  Your safety is important to us.  If you have drug allergies check your prescription carefully.    You can use MyChart to ask questions about today's visit, request a non-urgent call back, or ask for a work or school excuse for 24 hours related to this e-Visit. If it has been greater than 24 hours you will need to follow up with your provider, or enter a new e-Visit to address those concerns.  You will get an e-mail in the next two days asking about your experience.  I hope that your e-visit has been valuable and will speed your recovery. Thank you for using e-visits.  Greater than 5 minutes, yet less than 10 minutes of time have been spent researching, coordinating, and implementing care for this patient today  

## 2019-08-26 ENCOUNTER — Ambulatory Visit: Payer: Self-pay | Admitting: Family Medicine

## 2019-10-02 ENCOUNTER — Encounter: Payer: Self-pay | Admitting: Family Medicine

## 2019-10-02 ENCOUNTER — Ambulatory Visit: Payer: BC Managed Care – PPO | Admitting: Family Medicine

## 2019-10-02 ENCOUNTER — Other Ambulatory Visit: Payer: Self-pay

## 2019-10-02 VITALS — BP 110/70 | HR 82 | Temp 97.4°F | Wt 153.2 lb

## 2019-10-02 DIAGNOSIS — G40B09 Juvenile myoclonic epilepsy, not intractable, without status epilepticus: Secondary | ICD-10-CM

## 2019-10-02 MED ORDER — LEVETIRACETAM 500 MG PO TABS: 500.0000 mg | ORAL_TABLET | Freq: Two times a day (BID) | ORAL | 4 refills | Status: DC

## 2019-10-02 NOTE — Patient Instructions (Signed)
Continue levetiracetam 500mg  twice daily   Stay well hydrated. Eat well balanced diet and get regular exercise.   Follow up in 1 year   Seizure, Adult A seizure is a sudden burst of abnormal electrical activity in the brain. Seizures usually last from 30 seconds to 2 minutes. They can cause many different symptoms. Usually, seizures are not harmful unless they last a long time. What are the causes? Common causes of this condition include:  Fever or infection.  Conditions that affect the brain, such as: ? A brain abnormality that you were born with. ? A brain or head injury. ? Bleeding in the brain. ? A tumor. ? Stroke. ? Brain disorders such as autism or cerebral palsy.  Low blood sugar.  Conditions that are passed from parent to child (are inherited).  Problems with substances, such as: ? Having a reaction to a drug or a medicine. ? Suddenly stopping the use of a substance (withdrawal). In some cases, the cause may not be known. A person who has repeated seizures over time without a clear cause has a condition called epilepsy. What increases the risk? You are more likely to get this condition if you have:  A family history of epilepsy.  Had a seizure in the past.  A brain disorder.  A history of head injury, lack of oxygen at birth, or strokes. What are the signs or symptoms? There are many types of seizures. The symptoms vary depending on the type of seizure you have. Examples of symptoms during a seizure include:  Shaking (convulsions).  Stiffness in the body.  Passing out (losing consciousness).  Head nodding.  Staring.  Not responding to sound or touch.  Loss of bladder control and bowel control. Some people have symptoms right before and right after a seizure happens. Symptoms before a seizure may include:  Fear.  Worry (anxiety).  Feeling like you may vomit (nauseous).  Feeling like the room is spinning (vertigo).  Feeling like you saw or  heard something before (dj vu).  Odd tastes or smells.  Changes in how you see. You may see flashing lights or spots. Symptoms after a seizure happens can include:  Confusion.  Sleepiness.  Headache.  Weakness on one side of the body. How is this treated? Most seizures will stop on their own in under 5 minutes. In these cases, no treatment is needed. Seizures that last longer than 5 minutes will usually need treatment. Treatment can include:  Medicines given through an IV tube.  Avoiding things that are known to cause your seizures. These can include medicines that you take for another condition.  Medicines to treat epilepsy.  Surgery to stop the seizures. This may be needed if medicines do not help. Follow these instructions at home: Medicines  Take over-the-counter and prescription medicines only as told by your doctor.  Do not eat or drink anything that may keep your medicine from working, such as alcohol. Activity  Do not do any activities that would be dangerous if you had another seizure, like driving or swimming. Wait until your doctor says it is safe for you to do them.  If you live in the [place] ask your local DMV (department of motor vehicles) when you can drive.  Get plenty of rest. Teaching others Teach friends and family what to do when you have a seizure. They should:  Lay you on the ground.  Protect your head and body.  Loosen any tight clothing around your neck.  Turn  you on your side.  Not hold you down.  Not put anything into your mouth.  Know whether or not you need emergency care.  Stay with you until you are better.  General instructions  Contact your doctor each time you have a seizure.  Avoid anything that gives you seizures.  Keep a seizure diary. Write down: ? What you think caused each seizure. ? What you remember about each seizure.  Keep all follow-up visits as told by your doctor. This is important. Contact a doctor  if:  You have another seizure.  You have seizures more often.  There is any change in what happens during your seizures.  You keep having seizures with treatment.  You have symptoms of being sick or having an infection. Get help right away if:  You have a seizure that: ? Lasts longer than 5 minutes. ? Is different than seizures you had before. ? Makes it harder to breathe. ? Happens after you hurt your head.  You have any of these symptoms after a seizure: ? Not being able to speak. ? Not being able to use a part of your body. ? Confusion. ? A bad headache.  You have two or more seizures in a row.  You do not wake up right after a seizure.  You get hurt during a seizure. These symptoms may be an emergency. Do not wait to see if the symptoms will go away. Get medical help right away. Call your local emergency services (911 in the [place] Do not drive yourself to the hospital. Summary  Seizures usually last from 30 seconds to 2 minutes. Usually, they are not harmful unless they last a long time.  Do not eat or drink anything that may keep your medicine from working, such as alcohol.  Teach friends and family what to do when you have a seizure.  Contact your doctor each time you have a seizure. This information is not intended to replace advice given to you by your health care provider. Make sure you discuss any questions you have with your health care provider. Document Revised: 08/17/2018 Document Reviewed: 08/17/2018 Elsevier Patient Education  [employer].

## 2019-10-02 NOTE — Progress Notes (Signed)
PATIENT: Sebrina Kessner DOB: [birth year]  REASON FOR VISIT: follow up HISTORY FROM: patient  Chief Complaint  Patient presents with  . Follow-up    New room w/ window, alone. Last seen 11/08/2017.   . Seizures    Takes keppra 500mg  po BID. Last seizure 07/24/2016. She is doing well on Keppra.      HISTORY OF PRESENT ILLNESS: Today [birth year] Zissy Hamlett is a 24 y.o. female here today for follow up for seizures. She continues levetiracetam 500mg  twice daily. She is tolerating medications well with no obvious adverse effects. She continues to work full time. She is feeling well today and without complaints.   HISTORY: (copied from Dr Cathren Laine note on 11/08/2017)  Interval history 11/08/2017: Very stable, no issues, no seizures, good compliance, doing great. She works at [employer].   Interval history 11/08/2016: Patient returns today for follow-up of seizures since age of 1. She has a history of Seizures and Generalized Tonic-Clonic Seizures. She Was Started on Keppra. Juvenile Myoclonic Epilepsy Is in the Differential. I Last Appointment We Continued Her on Keppra, Discussed Lamictal Is a Good Alternative. She is under stress. Doing well on Keppra no more mood swings. No seizures or staring spells, no jerking movements. We discussed Pradhan staying on [place]. If she ever has any side effects to the Keppra or changes in mood we can switch to Lamictal. She reports some memory changes which are not uncommon on epilepsy medications we will monitor clinically.  QHU:[id number] [name] a 24 y.o.femalehere as a referral from Dr. Anthoney Harada seizures. Patient has a past medical history of late onset absence seizures since the age of 41. She presented to [facility] February, earlier this month, not on any antiepileptics he medications after having episodes of seizures. She started making grunting noises followed by 20-30 seconds of tonic-clonic seizure. He was postictal afterwards. She appeared to bite her  tongue and was having trouble breathing. Patient had been having morning seizures for over a month. Seizures started her at 65-19. She had been on Lamictal in the past with side effects. She was switched to [place] have been off this medication for several years. She did see Dr. Gaynell Face in the past.She was restarted on Keppra. She is here with parents who also provide information. She had a [facility] in 2015, then January 2017 and then [facility] and in December started increasing in frequency. She screams, head back, eyes wide open, goes on for 1-2 minutes. She had 3 in a row and went to the ED. She is irritable on keppra.   Reviewed notes, labs and imaging from outside physicians, which showed:  Reviewed notes from pediatric neurology.Patient was diagnosed with SEIZURES AT THE AGE OF 11. She was trialed on multiple medications with side effects including Keppra caused aggressiveness, ethosuximide GI upset and panic, lamotrigine was also poorly tolerated.The patient had onset of seizures in 2008. EEG in January 18, 2008, showed spike and slow wave discharges of 3 Hz and bitemporal slowing. The patient had EEG on June 10, 2011, and on the basis of that, decision was made to taper and discontinue levetiracetam.     REVIEW OF SYSTEMS: Out of a complete 14 system review of symptoms, the patient complains only of the following symptoms, none and all other reviewed systems are negative.   ALLERGIES: No Known Allergies  HOME MEDICATIONS: Outpatient Medications Prior to Visit  Medication Sig Dispense Refill  . levETIRAcetam (KEPPRA) 500 MG tablet Take 1 tablet (500 mg total) by mouth  2 (two) times daily. 180 tablet 4  . amoxicillin (AMOXIL) 500 MG capsule Take 1 capsule (500 mg total) by mouth 3 (three) times daily. 30 capsule 0  . dicyclomine (BENTYL) 20 MG tablet Take 1 tablet (20 mg total) by mouth every 6 (six) hours for 7 days. 28 tablet 0   No facility-administered medications prior to visit.     PAST MEDICAL HISTORY: Past Medical History:  Diagnosis Date  . Seizures (HCC)    HX of seizures 3 years ago    PAST SURGICAL HISTORY: Past Surgical History:  Procedure Laterality Date  . LACERATION REPAIR  1998   forehead    FAMILY HISTORY: Family History  Problem Relation Age of Onset  . Hyperlipidemia Mother   . Thyroid disease Mother   . Hypertension Maternal Grandmother   . ALS Maternal Grandmother   . Cancer Paternal Grandmother        lung cancer  . COPD Paternal Grandmother   . COPD Paternal Grandfather   . Seizures Neg Hx     SOCIAL HISTORY: Social History   Socioeconomic History  . Marital status: Single    Spouse name: n/a  . Number of children: 0  . Years of education: 36  . Highest education level: Not on file  Occupational History  . 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 Occupation: [employer]  Tobacco Use  . Smoking status: Never Smoker  . Smokeless tobacco: Never Used  Substance and Sexual Activity  . Alcohol use: Yes    Comment: Patient rarely drinks alcohol.  . Drug use: No  . Sexual activity: Never  Other Topics Concern  . 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 Difficulty of Paying Living Expenses:   Food Insecurity:   . Worried About Programme researcher, broadcasting/film/video in the Last Year:   . Barista in the Last Year:   Transportation Needs:   . Freight forwarder (Medical):   Marland Kitchen Lack of Transportation (Non-Medical):   Physical Activity:   . Days of Exercise per Week:   . Minutes of Exercise per Session:   Stress:   . Feeling of Stress :   Social Connections:   . Frequency of Communication with Friends and Family:   . Frequency of Social Gatherings with Friends and Family:   . Attends Religious Services:   . Active Member of  Clubs or Organizations:   . Attends Banker Meetings:   Marland Kitchen Marital Status:   Intimate Partner Violence:   . Fear of Current or Ex-Partner:   . Emotionally Abused:   Marland Kitchen Physically Abused:   . Sexually Abused:       PHYSICAL EXAM  Vitals:   10/02/19 0713  BP: 110/70  Pulse: 82  Temp: (!) 97.4 F (36.3 C)  SpO2: 98%  Weight: 153 lb 3.2 oz (69.5 kg)   Body mass index is 26.3 kg/m.  Generalized: Well developed, in no acute distress  Cardiology: normal rate and rhythm, no murmur noted Respiratory: clear to auscultation bilaterally  Neurological examination  Mentation: Alert oriented to time, place, history taking. Follows all commands speech and language fluent Cranial nerve II-XII: Pupils were equal round reactive to light. Extraocular movements were full, visual field were full  on confrontational test. Facial sensation and strength were normal. Uvula tongue midline. Head turning and shoulder shrug  were normal and symmetric. Motor: The motor testing reveals 5 over 5 strength of all 4 extremities. Good symmetric motor tone is noted throughout.  Sensory: Sensory testing is intact to soft touch on all 4 extremities. No evidence of extinction is noted.  Coordination: Cerebellar testing reveals good finger-nose-finger and heel-to-shin bilaterally.  Gait and station: Gait is normal.    DIAGNOSTIC DATA (LABS, IMAGING, TESTING) - I reviewed patient records, labs, notes, testing and imaging myself where available.  No flowsheet data found.   Lab Results  Component Value Date   WBC 7.6 07/25/2016   HGB 11.3 (L) 07/25/2016   HCT 34.6 (L) 07/25/2016   MCV 86.7 07/25/2016   PLT 163 07/25/2016      Component Value Date/Time   NA 140 07/25/2016 0138   K 3.8 07/25/2016 0138   CL 110 07/25/2016 0138   CO2 24 07/25/2016 0138   GLUCOSE 87 07/25/2016 0138   BUN 8 07/25/2016 0138   CREATININE 0.66 07/25/2016 0138   CREATININE 0.74 07/10/2014 1728   CALCIUM 9.1 07/25/2016  0138   PROT 6.5 07/10/2014 1728   ALBUMIN 4.3 07/10/2014 1728   AST 13 07/10/2014 1728   ALT 11 07/10/2014 1728   ALKPHOS 56 07/10/2014 1728   BILITOT 0.5 07/10/2014 1728   GFRNONAA >60 07/25/2016 0138   GFRAA >60 07/25/2016 0138   No results found for: CHOL, HDL, LDLCALC, LDLDIRECT, TRIG, CHOLHDL No results found for: CXKG8J No results found for: VITAMINB12 No results found for: TSH     ASSESSMENT AND PLAN 24 y.o. year old female  has a past medical history of Seizures (HCC). here with     ICD-10-CM   1. Nonintractable juvenile myoclonic epilepsy without status epilepticus (HCC)  G40.B78     Rumaisa is doing very well today.  She continues levetiracetam 500 mg twice daily.  No seizure activity since 2018.  She is tolerating medications well.  We will continue current therapy.  She was encouraged to stay well-hydrated.  I have advised that she follow-up with primary care yearly for CPE.  She will return to see [place] in 1 year, sooner if needed.  She verbalizes understanding and agreement with this plan.   No orders of the defined types were placed in this encounter.    Meds ordered this encounter  Medications  . DISCONTD: levETIRAcetam (KEPPRA) 500 MG tablet    Sig: Take 1 tablet (500 mg total) by mouth 2 (two) times daily.    Dispense:  180 tablet    Refill:  4    Order Specific Question:   Supervising Provider    Answer:   Anson Fret [id number]  . levETIRAcetam (KEPPRA) 500 MG tablet    Sig: Take 1 tablet (500 mg total) by mouth 2 (two) times daily.    Dispense:  180 tablet    Refill:  4    Order Specific Question:   Supervising Provider    Answer:   Anson Fret [id number]      I spent 15 minutes with the patient. 50% of this time was spent counseling and educating patient on plan of care and medications.    Shawnie Dapper, FNP-C 10/02/2019, 7:38 AM [facility] [address] [place], [place] [address] [phone]

## 2019-10-03 ENCOUNTER — Telehealth: Payer: PRIVATE HEALTH INSURANCE | Admitting: Physician Assistant

## 2019-10-03 DIAGNOSIS — R399 Unspecified symptoms and signs involving the genitourinary system: Secondary | ICD-10-CM

## 2019-10-03 MED ORDER — NITROFURANTOIN MONOHYD MACRO 100 MG PO CAPS: 100.0000 mg | ORAL_CAPSULE | Freq: Two times a day (BID) | ORAL | 0 refills | Status: DC

## 2019-10-03 NOTE — Progress Notes (Signed)

## 2019-11-05 ENCOUNTER — Telehealth: Payer: BC Managed Care – PPO | Admitting: Family

## 2019-11-05 DIAGNOSIS — L559 Sunburn, unspecified: Secondary | ICD-10-CM

## 2019-11-05 MED ORDER — DICLOFENAC SODIUM 1 % EX GEL: 2.0000 g | Freq: Four times a day (QID) | CUTANEOUS | 2 refills | Status: DC

## 2019-11-05 NOTE — Progress Notes (Signed)
We are sorry that you are not feeling well.  Here is how we plan to help!  Based on what you have shared with me, I'd like to share with you a treatment plan for sunburn.   Most sunburn is a first degree burn that turns the skin pink or red.  It can be painful to touch.  If you stayed in the sun for a prolonged period this might have progressed to a second degree burn with blistering!  Usually the pain and swelling starts after about 4 hours, peaks at 24 hours and begins to improve after 48 hours or about 2 days.  REMEMBER prolonged exposure to the sun increases your risk of skin cancer so use sunscreen before you go outside!  We will give you more information about sunscreen use later in your care plan.  Your sunburn can be managed by self-care at home.  Please use the following care guide to manage your sunburn.  If you symptoms worsen, you have other questions or concerns, or you develop any of the warnings signs listed in your care plan you will need to seek a face to face visit with a provider without waiting!  I have sent a Voltaren gel that you can apply to your face, but avoid your eyes. This is anti-inflammatory cream that can help with pain.    Home Care Advice for Treating Mild Sunburn:  1. Take Ibuprofen (Advil, Motrin) for pain relief as soon as possible.  The adult dosage is up to 600 mg every 6 hours.  Starting within 6 hours of sun exposure may greatly reduce your discomfort.  If you cannot take Ibuprofen you may use Acetaminophen instead.  Do not take Ibuprofen if you have stomach problems, kidney disease or are pregnant.  Do not take Ibuprofen if you have been told by your doctor or pharmacist to avoid this class of drugs.  Do not take Acetaminophen if you have liver disease.  Read the package warnings on any medication that you take!  2.  Use a steroid cream on the affected skin.  If you apply an over the counter steroid       cream as soon as possible and repeat it  three times a day it may reduce the pain and      and swelling.  Until you get the steroid cream you may start with a moistening cream      cream or aloe gel.  3. For second or third degree sunburn with painful blistering, you can use an over the         counter product Burn Jel Plus Pain Relieving Gel. Apply in a thick even layer over the     affected area not more than 3 to 4 times daily If you need to cover the area to protect      it from friction of clothing, you can use Moist Burn Pads such as Hydrogel Burn Pads       which are available over the counter.  4.  Apply cool compresses to the burned areas several times a day.  5.  Avoid soap on the sunburned areas.  6.  Drink plenty of water.  It is easy to get dehydrated from prolong time in the sun      Outdoors.  7.  For any broken blisters:  Trim off the dead skin with fine scissors.  It is wise to clean the scissor with alcohol before use.  Apply antibiotic ointments to  the blister.  Apply twice a day for three days.  There are triple antibiotic ointments with topical pain relievers available at stores.  Caution:leave intact blisters alone.  They are protecting the skin and will allow it to heal.  8.  Taking Vitamin C orally may reduce sun damage to your skin.  Follow the      Instructions on the bottle.  The recommended adult dosage is 2 grams.  What to Expect:  1. Pain usually stops after 2 or 3 days. 2. Skin flaking and peeling usually occurs for 3-7 days after a sunburn.  Call your provider if:  1. You feel very weak or have difficulty standing. 2. Blister develops on your face. 3. You become sensitive to light because of eye pain. 4. Your skin looks infected (red streaks, puss or worsening tenderness after 48 hours. 5. You feel you should be seen.  Preventing Sunburns:  1. Apply 20-30 SPF sunscreen to your skin before going into the sun. 2. Reapply every 2-4 hours or after sweating or swimming. 3. Sunscreens  protect from sunburns but do not completely prevent skin damage.  Wynelle Link exposure still increases your risk of premature aging and skin cancers.  Your e-visit answers were reviewed by a board certified advanced clinical practitioner to complete your personal care plan.  Depending on the condition, your plan could have included both over the counter or prescription medications.  If there is a problem please reply  once you have received a response from your provider.  Your safety is important to [place].  If you have drug allergies check your prescription carefully.    You can use MyChart to ask questions about today's visit, request a non-urgent call back, or ask for a work or school excuse for 24 hours related to this e-Visit. If it has been greater than 24 hours you will need to follow up with your provider, or enter a new e-Visit to address those concerns.  You will get an e-mail in the next two days asking about your experience.  I hope that your e-visit has been valuable and will speed your recovery. Thank you for using e-visits.  Approximately 5 minutes was spent documenting and reviewing patient's chart.

## 2020-03-24 DIAGNOSIS — N911 Secondary amenorrhea: Secondary | ICD-10-CM | POA: Diagnosis not present

## 2020-04-01 DIAGNOSIS — Z3685 Encounter for antenatal screening for Streptococcus B: Secondary | ICD-10-CM | POA: Diagnosis not present

## 2020-04-01 DIAGNOSIS — Z3481 Encounter for supervision of other normal pregnancy, first trimester: Secondary | ICD-10-CM | POA: Diagnosis not present

## 2020-04-01 DIAGNOSIS — Z3480 Encounter for supervision of other normal pregnancy, unspecified trimester: Secondary | ICD-10-CM | POA: Diagnosis not present

## 2020-04-01 LAB — OB RESULTS CONSOLE ANTIBODY SCREEN: Antibody Screen: NEGATIVE

## 2020-04-01 LAB — OB RESULTS CONSOLE RUBELLA ANTIBODY, IGM: Rubella: IMMUNE

## 2020-04-01 LAB — OB RESULTS CONSOLE GC/CHLAMYDIA
Chlamydia: NEGATIVE
Gonorrhea: NEGATIVE

## 2020-04-01 LAB — OB RESULTS CONSOLE ABO/RH: RH Type: POSITIVE

## 2020-04-01 LAB — OB RESULTS CONSOLE RPR: RPR: NONREACTIVE

## 2020-04-01 LAB — OB RESULTS CONSOLE HIV ANTIBODY (ROUTINE TESTING): HIV: NONREACTIVE

## 2020-04-01 LAB — OB RESULTS CONSOLE HEPATITIS B SURFACE ANTIGEN: Hepatitis B Surface Ag: NEGATIVE

## 2020-04-03 LAB — HEPATITIS C ANTIBODY: HCV Ab: NEGATIVE

## 2020-04-06 ENCOUNTER — Encounter: Payer: Self-pay | Admitting: Family Medicine

## 2020-04-07 ENCOUNTER — Encounter (HOSPITAL_COMMUNITY): Payer: Self-pay | Admitting: Obstetrics and Gynecology

## 2020-04-07 ENCOUNTER — Other Ambulatory Visit: Payer: Self-pay

## 2020-04-07 ENCOUNTER — Inpatient Hospital Stay (HOSPITAL_COMMUNITY)
Admission: AD | Admit: 2020-04-07 | Discharge: 2020-04-07 | Disposition: A | Discharge status: Home / Self Care | Payer: BC Managed Care – PPO | Attending: Obstetrics and Gynecology | Admitting: Obstetrics and Gynecology

## 2020-04-07 DIAGNOSIS — O219 Vomiting of pregnancy, unspecified: Secondary | ICD-10-CM | POA: Diagnosis not present

## 2020-04-07 DIAGNOSIS — O26611 Liver and biliary tract disorders in pregnancy, first trimester: Secondary | ICD-10-CM | POA: Diagnosis not present

## 2020-04-07 DIAGNOSIS — R945 Abnormal results of liver function studies: Secondary | ICD-10-CM

## 2020-04-07 DIAGNOSIS — Z79899 Other long term (current) drug therapy: Secondary | ICD-10-CM | POA: Diagnosis not present

## 2020-04-07 DIAGNOSIS — R748 Abnormal levels of other serum enzymes: Secondary | ICD-10-CM | POA: Diagnosis not present

## 2020-04-07 DIAGNOSIS — O99891 Other specified diseases and conditions complicating pregnancy: Secondary | ICD-10-CM

## 2020-04-07 DIAGNOSIS — Z3A09 9 weeks gestation of pregnancy: Secondary | ICD-10-CM

## 2020-04-07 DIAGNOSIS — Z791 Long term (current) use of non-steroidal anti-inflammatories (NSAID): Secondary | ICD-10-CM | POA: Diagnosis not present

## 2020-04-07 LAB — COMPREHENSIVE METABOLIC PANEL
ALT: 100 U/L — ABNORMAL HIGH (ref 0–44)
AST: 71 U/L — ABNORMAL HIGH (ref 15–41)
Albumin: 4.2 g/dL (ref 3.5–5.0)
Alkaline Phosphatase: 50 U/L (ref 38–126)
Anion gap: 15 (ref 5–15)
BUN: 9 mg/dL (ref 6–20)
CO2: 15 mmol/L — ABNORMAL LOW (ref 22–32)
Calcium: 9.5 mg/dL (ref 8.9–10.3)
Chloride: 103 mmol/L (ref 98–111)
Creatinine, Ser: 0.53 mg/dL (ref 0.44–1.00)
GFR, Estimated: >60 mL/min (ref >60)
Glucose, Bld: 72 mg/dL (ref 70–99)
Potassium: 4.7 mmol/L (ref 3.5–5.1)
Sodium: 133 mmol/L — ABNORMAL LOW (ref 135–145)
Total Bilirubin: 1.4 mg/dL — ABNORMAL HIGH (ref 0.3–1.2)
Total Protein: 6.7 g/dL (ref 6.5–8.1)

## 2020-04-07 LAB — URINALYSIS, ROUTINE W REFLEX MICROSCOPIC
Bilirubin Urine: NEGATIVE
Glucose, UA: NEGATIVE mg/dL
Hgb urine dipstick: NEGATIVE
Ketones, ur: 80 mg/dL — AB
Nitrite: NEGATIVE
Protein, ur: 100 mg/dL — AB
Specific Gravity, Urine: 1.03 (ref 1.005–1.030)
pH: 5 (ref 5.0–8.0)

## 2020-04-07 LAB — CBC
HCT: 36.5 % (ref 36.0–46.0)
Hemoglobin: 11.9 g/dL — ABNORMAL LOW (ref 12.0–15.0)
MCH: 28.5 pg (ref 26.0–34.0)
MCHC: 32.6 g/dL (ref 30.0–36.0)
MCV: 87.5 fL (ref 80.0–100.0)
Platelets: 184 10*3/uL (ref 150–400)
RBC: 4.17 MIL/uL (ref 3.87–5.11)
RDW: 12.5 % (ref 11.5–15.5)
WBC: 8.5 10*3/uL (ref 4.0–10.5)
nRBC: 0 % (ref 0.0–0.2)

## 2020-04-07 MED ORDER — FAMOTIDINE 20 MG PO TABS: 20.0000 mg | ORAL_TABLET | Freq: Every day | ORAL | 1 refills | Status: DC

## 2020-04-07 MED ORDER — FAMOTIDINE IN NACL 20-0.9 MG/50ML-% IV SOLN
20.0000 mg | Freq: Once | INTRAVENOUS | Status: AC
  Administered 2020-04-07: 20 mg via INTRAVENOUS
  Filled 2020-04-07: qty 50

## 2020-04-07 MED ORDER — LACTATED RINGERS IV BOLUS
1000.0000 mL | Freq: Once | INTRAVENOUS | Status: AC
  Administered 2020-04-07: 1000 mL via INTRAVENOUS

## 2020-04-07 MED ORDER — SODIUM CHLORIDE 0.9 % IV SOLN
8.0000 mg | Freq: Once | INTRAVENOUS | Status: AC
  Administered 2020-04-07: 8 mg via INTRAVENOUS
  Filled 2020-04-07: qty 4

## 2020-04-07 NOTE — MAU Provider Note (Signed)
History     CSN: [id number]  Arrival date and time: 04/07/20 1259   First Provider Initiated Contact with Patient 04/07/20 1506      Chief Complaint  Patient presents with  . Emesis   [name] is a 24 y.o. G1P0 at [redacted]w[redacted]d who presents to MAU for nausea and vomiting. Patient reports had [place] to confirm dating. Pt denies smoking marijuana.  Onset: 4 weeks Location: stomach Duration: 4 weeks Character: constant nausea, vomiting x 3-4 in 24hours, has thrown up up to 6 times Aggravating/Associated: none/none Relieving: smells Treatment: Vit B6/Unisom - did not work  Pt denies VB, vaginal discharge/odor/itching. Pt denies abdominal pain, constipation, diarrhea, or urinary problems. Pt denies fever, chills, fatigue, sweating or changes in appetite. Pt denies SOB or chest pain. Pt denies dizziness, HA, light-headedness, weakness.  Problems this pregnancy include: seizures. Allergies? NKDA Current medications/supplements? Keppra Prenatal care provider? [facility], next appt 04/16/2020   OB History    Gravida  1   Para      Term      Preterm      AB      Living        SAB      TAB      Ectopic      Multiple      Live Births              Past Medical History:  Diagnosis Date  . Seizures (HCC)    HX of seizures 3 years ago    Past Surgical History:  Procedure Laterality Date  . LACERATION REPAIR  1998   forehead    Family History  Problem Relation Age of Onset  . Hyperlipidemia Mother   . Thyroid disease Mother   . Hypertension Maternal Grandmother   . ALS Maternal Grandmother   . Cancer Paternal Grandmother        lung cancer  . COPD Paternal Grandmother   . COPD Paternal Grandfather   . Seizures Neg Hx     Social History   Tobacco Use  . Smoking status: Never Smoker  . Smokeless tobacco: Never Used  Vaping Use  . Vaping Use: Former  Substance Use Topics  . Alcohol use: Not Currently    Comment: Patient rarely  drinks alcohol.  . Drug use: No    Allergies: No Known Allergies  Medications Prior to Admission  Medication Sig Dispense Refill Last Dose  . levETIRAcetam (KEPPRA) 500 MG tablet Take 1 tablet (500 mg total) by mouth 2 (two) times daily. 180 tablet 4 04/07/2020 at 0800  . diclofenac Sodium (VOLTAREN) 1 % GEL Apply 2 g topically 4 (four) times daily. 100 g 2   . nitrofurantoin, macrocrystal-monohydrate, (MACROBID) 100 MG capsule Take 1 capsule (100 mg total) by mouth 2 (two) times daily. 10 capsule 0     Review of Systems  Constitutional: Negative for chills, diaphoresis, fatigue and fever.  Eyes: Negative for visual disturbance.  Respiratory: Negative for shortness of breath.   Cardiovascular: Negative for chest pain.  Gastrointestinal: Positive for nausea and vomiting. Negative for abdominal pain, constipation and diarrhea.  Genitourinary: Negative for dysuria, flank pain, frequency, pelvic pain, urgency, vaginal bleeding and vaginal discharge.  Neurological: Negative for dizziness, weakness, light-headedness and headaches.   Physical Exam   Blood pressure 109/63, pulse 71, temperature 98.7 F (37.1 C), temperature source Oral, resp. rate 18, height 5\' 4"  (1.626 m), weight 59.4 kg, SpO2 100 %.  Patient Vitals for  the past 24 hrs:  BP Temp Temp src Pulse Resp SpO2 Height Weight  04/07/20 1841 109/63 98.7 F (37.1 C) Oral 71 18 100 % -- --  04/07/20 1354 121/69 98.3 F (36.8 C) -- 72 18 -- 5\' 4"  (1.626 m) 59.4 kg   Physical Exam Vitals and nursing note reviewed.  Constitutional:      General: She is not in acute distress.    Appearance: Normal appearance. She is normal weight. She is ill-appearing. She is not toxic-appearing or diaphoretic.  HENT:     Head: Normocephalic and atraumatic.  Pulmonary:     Effort: Pulmonary effort is normal.  Neurological:     Mental Status: She is alert and oriented to person, place, and time.  Psychiatric:        Mood and Affect: Mood  normal.        Behavior: Behavior normal.        Thought Content: Thought content normal.        Judgment: Judgment normal.    Results for orders placed or performed during the hospital encounter of 04/07/20 (from the past 24 hour(s))  Urinalysis, Routine w reflex microscopic Urine, Clean Catch     Status: Abnormal   Collection Time: 04/07/20  2:08 PM  Result Value Ref Range   Color, Urine AMBER (A) YELLOW   APPearance CLOUDY (A) CLEAR   Specific Gravity, Urine 1.030 1.005 - 1.030   pH 5.0 5.0 - 8.0   Glucose, UA NEGATIVE NEGATIVE mg/dL   Hgb urine dipstick NEGATIVE NEGATIVE   Bilirubin Urine NEGATIVE NEGATIVE   Ketones, ur 80 (A) NEGATIVE mg/dL   Protein, ur 04/09/20 (A) NEGATIVE mg/dL   Nitrite NEGATIVE NEGATIVE   Leukocytes,Ua SMALL (A) NEGATIVE   RBC / HPF 6-10 0 - 5 RBC/hpf   WBC, UA 11-20 0 - 5 WBC/hpf   Bacteria, UA RARE (A) NONE SEEN   Squamous Epithelial / LPF 11-20 0 - 5   Mucus PRESENT   CBC     Status: Abnormal   Collection Time: 04/07/20  3:51 PM  Result Value Ref Range   WBC 8.5 4.0 - 10.5 K/uL   RBC 4.17 3.87 - 5.11 MIL/uL   Hemoglobin 11.9 (L) 12.0 - 15.0 g/dL   HCT 04/09/20 36 - 46 %   MCV 87.5 80.0 - 100.0 fL   MCH 28.5 26.0 - 34.0 pg   MCHC 32.6 30.0 - 36.0 g/dL   RDW 96.7 89.3 - 81.0 %   Platelets 184 150 - 400 K/uL   nRBC 0.0 0.0 - 0.2 %  Comprehensive metabolic panel     Status: Abnormal   Collection Time: 04/07/20  3:51 PM  Result Value Ref Range   Sodium 133 (L) 135 - 145 mmol/L   Potassium 4.7 3.5 - 5.1 mmol/L   Chloride 103 98 - 111 mmol/L   CO2 15 (L) 22 - 32 mmol/L   Glucose, Bld 72 70 - 99 mg/dL   BUN 9 6 - 20 mg/dL   Creatinine, Ser 04/09/20 0.44 - 1.00 mg/dL   Calcium 9.5 8.9 - 1.02 mg/dL   Total Protein 6.7 6.5 - 8.1 g/dL   Albumin 4.2 3.5 - 5.0 g/dL   AST 71 (H) 15 - 41 U/L   ALT 100 (H) 0 - 44 U/L   Alkaline Phosphatase 50 38 - 126 U/L   Total Bilirubin 1.4 (H) 0.3 - 1.2 mg/dL   GFR, Estimated 58.5 >27 mL/min   Anion gap 15  5 - 15   No  results found.  MAU Course  Procedures  MDM -N/V in pregnancy -weight today 59.4, weight was last recorded April 2021 at 69.5kg -UA: amber/cloudy/80ketones/100PRO/sm leuks/rare bacteria, sending urine for culture -CBC w/Diff: WNL -CMP: CO2 15, AST/ALT 71/100, Anion Gap 15 -1L LR + 20mg  Pepcid + 8mg  Zofran given, pt reports NV now resolved -pt able to urinate after fluids -PO challenge successful -consulted with Dr. about CMP, no need to repeat labs as likely related to nausea and vomiting -called and notified Dr. of elevated liver enzymes -pt discharged to home in stable condition  Orders Placed This Encounter  Procedures  . Culture, OB Urine    Standing Status:   Standing    Number of Occurrences:   1  . Urinalysis, Routine w reflex microscopic Urine, Clean Catch    Standing Status:   Standing    Number of Occurrences:   1  . CBC    Standing Status:   Standing    Number of Occurrences:   1  . Comprehensive metabolic panel    Standing Status:   Standing    Number of Occurrences:   1  . Insert peripheral IV    Standing Status:   Standing    Number of Occurrences:   1  . Discharge patient    Order Specific Question:   Discharge disposition    Answer:   01-Home or Self Care [1]    Order Specific Question:   Discharge patient date    Answer:   04/07/2020   Meds ordered this encounter  Medications  . lactated ringers bolus 1,000 mL  . ondansetron (ZOFRAN) 8 mg in sodium chloride 0.9 % 50 mL IVPB  . famotidine (PEPCID) IVPB 20 mg premix  . lactated ringers bolus 1,000 mL  . famotidine (PEPCID) 20 MG tablet    Sig: Take 1 tablet (20 mg total) by mouth daily.    Dispense:  30 tablet    Refill:  1    Order Specific Question:   Supervising Provider    Answer:   Henderson Cloud [[id number]]    Assessment and Plan   1. Nausea and vomiting in pregnancy   2. Elevated liver enzymes     Allergies as of 04/07/2020   No Known Allergies     Medication List     TAKE these medications   diclofenac Sodium 1 % Gel Commonly known as: Voltaren Apply 2 g topically 4 (four) times daily.   famotidine 20 MG tablet Commonly known as: Pepcid Take 1 tablet (20 mg total) by mouth daily.   levETIRAcetam 500 MG tablet Commonly known as: KEPPRA Take 1 tablet (500 mg total) by mouth 2 (two) times daily.   nitrofurantoin (macrocrystal-monohydrate) 100 MG capsule Commonly known as: Macrobid Take 1 capsule (100 mg total) by mouth 2 (two) times daily.       -will call with culture results, if positive -safe meds in pregnancy list given -pt advised to take medications around the clock and not to stop taking if feeling better -pt reports has RX for Zofran from OB -RX Pepcid -discussed can continue to use Unisom and B6 with Zofran and Pepcid, if desired -discussed nonpharmacologic and pharmacologic treatments of N/V -discussed normal expectations for N/V in pregnancy -pt discharged to home in stable condition   Adam Phenix E Roanne Haye 04/07/2020, 7:28 PM

## 2020-04-07 NOTE — Discharge Instructions (Signed)
Morning Sickness  Morning sickness is when a woman feels nauseous during pregnancy. This nauseous feeling may or may not come with vomiting. It often occurs in the morning, but it can be a problem at any time of day. Morning sickness is most common during the first trimester. In some cases, it may continue throughout pregnancy. Although morning sickness is unpleasant, it is usually harmless unless the woman develops severe and continual vomiting (hyperemesis gravidarum), a condition that requires more intense treatment. What are the causes? The exact cause of this condition is not known, but it seems to be related to normal hormonal changes that occur in pregnancy. What increases the risk? You are more likely to develop this condition if:  You experienced nausea or vomiting before your pregnancy.  You had morning sickness during a previous pregnancy.  You are pregnant with more than one baby, such as twins. What are the signs or symptoms? Symptoms of this condition include:  Nausea.  Vomiting. How is this diagnosed? This condition is usually diagnosed based on your signs and symptoms. How is this treated? In many cases, treatment is not needed for this condition. Making some changes to what you eat may help to control symptoms. Your health care provider may also prescribe or recommend:  Vitamin B6 supplements.  Anti-nausea medicines.  Ginger. Follow these instructions at home: Medicines  Take over-the-counter and prescription medicines only as told by your health care provider. Do not use any prescription, over-the-counter, or herbal medicines for morning sickness without first talking with your health care provider.  Taking multivitamins before getting pregnant can prevent or decrease the severity of morning sickness in most women. Eating and drinking  Eat a piece of dry toast or crackers before getting out of bed in the morning.  Eat 5 or 6 small meals a day.  Eat dry and  bland foods, such as rice or a baked potato. Foods that are high in carbohydrates are often helpful.  Avoid greasy, fatty, and spicy foods.  Have someone cook for you if the smell of any food causes nausea and vomiting.  If you feel nauseous after taking prenatal vitamins, take the vitamins at night or with a snack.  Snack on protein foods between meals if you are hungry. Nuts, yogurt, and cheese are good options.  Drink fluids throughout the day.  Try ginger ale made with real ginger, ginger tea made from fresh grated ginger, or ginger candies. General instructions  Do not use any products that contain nicotine or tobacco, such as cigarettes and e-cigarettes. If you need help quitting, ask your health care provider.  Get an air purifier to keep the air in your house free of odors.  Get plenty of fresh air.  Try to avoid odors that trigger your nausea.  Consider trying these methods to help relieve symptoms: ? Wearing an acupressure wristband. These wristbands are often worn for seasickness. ? Acupuncture. Contact a health care provider if:  Your home remedies are not working and you need medicine.  You feel dizzy or light-headed.  You are losing weight. Get help right away if:  You have persistent and uncontrolled nausea and vomiting.  You faint.  You have severe pain in your abdomen. Summary  Morning sickness is when a woman feels nauseous during pregnancy. This nauseous feeling may or may not come with vomiting.  Morning sickness is most common during the first trimester.  It often occurs in the morning, but it can be a problem at  any time of day.  In many cases, treatment is not needed for this condition. Making some changes to what you eat may help to control symptoms. This information is not intended to replace advice given to you by your health care provider. Make sure you discuss any questions you have with your health care provider. Document Revised:  05/12/2017 Document Reviewed: 07/02/2016 Elsevier Patient Education  2020 [employer].                        Safe Medications in Pregnancy    Acne: Benzoyl Peroxide Salicylic Acid  Backache/Headache: Tylenol: 2 regular strength every 4 hours OR              2 Extra strength every 6 hours  Colds/Coughs/Allergies: Benadryl (alcohol free) 25 mg every 6 hours as needed Breath right strips Claritin Cepacol throat lozenges Chloraseptic throat spray Cold-Eeze- up to three times per day Cough drops, alcohol free Flonase (by prescription only) Guaifenesin Mucinex Robitussin DM (plain only, alcohol free) Saline nasal spray/drops Sudafed (pseudoephedrine) & Actifed ** use only after [redacted] weeks gestation and if you do not have high blood pressure Tylenol Vicks Vaporub Zinc lozenges Zyrtec   Constipation: Colace Ducolax suppositories Fleet enema Glycerin suppositories Metamucil Milk of magnesia Miralax Senokot Smooth move tea  Diarrhea: Kaopectate Imodium A-D  *NO pepto Bismol  Hemorrhoids: Anusol Anusol HC Preparation H Tucks  Indigestion: Tums Maalox Mylanta Zantac  Pepcid  Insomnia: Benadryl (alcohol free) 25mg  every 6 hours as needed Tylenol PM Unisom, no Gelcaps  Leg Cramps: Tums MagGel  Nausea/Vomiting:  Bonine Dramamine Emetrol Ginger extract Sea bands Meclizine  Nausea medication to take during pregnancy:  Unisom (doxylamine succinate 25 mg tablets) Take one tablet daily at bedtime. If symptoms are not adequately controlled, the dose can be increased to a maximum recommended dose of two tablets daily (1/2 tablet in the morning, 1/2 tablet mid-afternoon and one at bedtime). Vitamin B6 100mg  tablets. Take one tablet twice a day (up to 200 mg per day).  Skin Rashes: Aveeno products Benadryl cream or 25mg  every 6 hours as needed Calamine Lotion 1% cortisone cream  Yeast infection: Gyne-lotrimin 7 Monistat 7   **If taking  multiple medications, please check labels to avoid duplicating the same active ingredients **take medication as directed on the label ** Do not exceed 4000 mg of tylenol in 24 hours **Do not take medications that contain aspirin or ibuprofen     Hyperemesis Gravidarum Hyperemesis gravidarum is a severe form of nausea and vomiting that happens during pregnancy. Hyperemesis is worse than morning sickness. It may cause you to have nausea or vomiting all day for many days. It may keep you from eating and drinking enough food and liquids, which can lead to dehydration, malnutrition, and weight loss. Hyperemesis usually occurs during the first half (the first 20 weeks) of pregnancy. It often goes away once a woman is in her second half of pregnancy. However, sometimes hyperemesis continues through an entire pregnancy. What are the causes? The cause of this condition is not known. It may be related to changes in chemicals (hormones) in the body during pregnancy, such as the high level of pregnancy hormone (human chorionic gonadotropin) or the increase in the female sex hormone (estrogen). What are the signs or symptoms? Symptoms of this condition include:  Nausea that does not go away.  Vomiting that does not allow you to keep any food down.  Weight loss.  Body fluid  loss (dehydration).  Having no desire to eat, or not liking food that you have previously enjoyed. How is this diagnosed? This condition may be diagnosed based on:  A physical exam.  Your medical history.  Your symptoms.  Blood tests.  Urine tests. How is this treated? This condition is managed by controlling symptoms. This may include:  Following an eating plan. This can help lessen nausea and vomiting.  Taking prescription medicines. An eating plan and medicines are often used together to help control symptoms. If medicines do not help relieve nausea and vomiting, you may need to receive fluids through an IV at the  hospital. Follow these instructions at home: Eating and drinking   Avoid the following: ? Drinking fluids with meals. Try not to drink anything during the 30 minutes before and after your meals. ? Drinking more than 1 cup of fluid at a time. ? Eating foods that trigger your symptoms. These may include spicy foods, coffee, high-fat foods, very sweet foods, and acidic foods. ? Skipping meals. Nausea can be more intense on an empty stomach. If you cannot tolerate food, do not force it. Try sucking on ice chips or other frozen items and make up for missed calories later. ? Lying down within 2 hours after eating. ? Being exposed to environmental triggers. These may include food smells, smoky rooms, closed spaces, rooms with strong smells, warm or humid places, overly loud and noisy rooms, and rooms with motion or flickering lights. Try eating meals in a well-ventilated area that is free of strong smells. ? Quick and sudden changes in your movement. ? Taking iron pills and multivitamins that contain iron. If you take prescription iron pills, do not stop taking them unless your health care provider approves. ? Preparing food. The smell of food can spoil your appetite or trigger nausea.  To help relieve your symptoms: ? Listen to your body. Everyone is different and has different preferences. Find what works best for you. ? Eat and drink slowly. ? Eat 5-6 small meals daily instead of 3 large meals. Eating small meals and snacks can help you avoid an empty stomach. ? In the morning, before getting out of bed, eat a couple of crackers to avoid moving around on an empty stomach. ? Try eating starchy foods as these are usually tolerated well. Examples include cereal, toast, bread, potatoes, pasta, rice, and pretzels. ? Include at least 1 serving of protein with your meals and snacks. Protein options include lean meats, poultry, seafood, beans, nuts, nut butters, eggs, cheese, and yogurt. ? Try eating a  protein-rich snack before bed. Examples of a protein-rick snack include cheese and crackers or a peanut butter sandwich made with 1 slice of whole-wheat bread and 1 tsp (5 g) of peanut butter. ? Eat or suck on things that have ginger in them. It may help relieve nausea. Add  tsp ground ginger to hot tea or choose ginger tea. ? Try drinking 100% fruit juice or an electrolyte drink. An electrolyte drink contains sodium, potassium, and chloride. ? Drink fluids that are cold, clear, and carbonated or sour. Examples include lemonade, ginger ale, lemon-lime soda, ice water, and sparkling water. ? Brush your teeth or use a mouth rinse after meals. ? Talk with your health care provider about starting a supplement of vitamin B6. General instructions  Take over-the-counter and prescription medicines only as told by your health care provider.  Follow instructions from your health care provider about eating or drinking restrictions.  Continue to take your prenatal vitamins as told by your health care provider. If you are having trouble taking your prenatal vitamins, talk with your health care provider about different options.  Keep all follow-up and pre-birth (prenatal) visits as told by your health care provider. This is important. Contact a health care provider if:  You have pain in your abdomen.  You have a severe headache.  You have vision problems.  You are losing weight.  You feel weak or dizzy. Get help right away if:  You cannot drink fluids without vomiting.  You vomit blood.  You have constant nausea and vomiting.  You are very weak.  You faint.  You have a fever and your symptoms suddenly get worse. Summary  Hyperemesis gravidarum is a severe form of nausea and vomiting that happens during pregnancy.  Making some changes to your eating habits may help relieve nausea and vomiting.  This condition may be managed with medicine.  If medicines do not help relieve nausea and  vomiting, you may need to receive fluids through an IV at the hospital. This information is not intended to replace advice given to you by your health care provider. Make sure you discuss any questions you have with your health care provider. Document Revised: 06/19/2017 Document Reviewed: 01/27/2016 Elsevier Patient Education  2020 [employer].

## 2020-04-07 NOTE — MAU Note (Signed)
Pt reports she has had n/v for several weeks. Today she had a migraine headache. Headache is less now after Excedrin. Stated she can't keep anything down. Feels like she can't keep her thoughts straigt/forgetful. Called office an they told her she could be dehydrated and need to come an be seen.

## 2020-04-09 LAB — CULTURE, OB URINE

## 2020-04-16 DIAGNOSIS — Z113 Encounter for screening for infections with a predominantly sexual mode of transmission: Secondary | ICD-10-CM | POA: Diagnosis not present

## 2020-04-27 DIAGNOSIS — Z3A12 12 weeks gestation of pregnancy: Secondary | ICD-10-CM | POA: Diagnosis not present

## 2020-04-27 DIAGNOSIS — Z36 Encounter for antenatal screening for chromosomal anomalies: Secondary | ICD-10-CM | POA: Diagnosis not present

## 2020-04-27 DIAGNOSIS — Z3A11 11 weeks gestation of pregnancy: Secondary | ICD-10-CM | POA: Diagnosis not present

## 2020-04-27 DIAGNOSIS — Z3682 Encounter for antenatal screening for nuchal translucency: Secondary | ICD-10-CM | POA: Diagnosis not present

## 2020-06-13 NOTE — L&D Delivery Note (Signed)
Delivery Note  SVD viable female Apgars 9,9 over 2nd degree ML lac.  Placenta delivered spontaneously intact with 3VC however some trailing membranes removed with sponge forceps.  MEU clear.  Will give ancef 2gm x 1 due to MEU and slight fever at delivery. Repair with 2-0 Chromic with good support and hemostasis noted.  R/V exam confirms.  PH art was not sent.   Mother and baby to couplet care and are doing well.  EBL 300cc  Candice Camp, MD

## 2020-06-15 DIAGNOSIS — Z3A18 18 weeks gestation of pregnancy: Secondary | ICD-10-CM | POA: Diagnosis not present

## 2020-06-15 DIAGNOSIS — Z3682 Encounter for antenatal screening for nuchal translucency: Secondary | ICD-10-CM | POA: Diagnosis not present

## 2020-06-15 DIAGNOSIS — Z363 Encounter for antenatal screening for malformations: Secondary | ICD-10-CM | POA: Diagnosis not present

## 2020-08-14 DIAGNOSIS — Z3481 Encounter for supervision of other normal pregnancy, first trimester: Secondary | ICD-10-CM | POA: Diagnosis not present

## 2020-10-05 NOTE — Progress Notes (Deleted)
PATIENT: Kaitlyn Kim DOB: [birth year]  REASON FOR VISIT: follow up HISTORY FROM: patient  Virtual Visit via Telephone Note  I connected with Kaitlyn Kim on 10/05/20 at  8:30 AM EDT by telephone and verified that I am speaking with the correct person using two identifiers.   I discussed the limitations, risks, security and privacy concerns of performing an evaluation and management service by telephone and the availability of in person appointments. I also discussed with the patient that there may be a patient responsible charge related to this service. The patient expressed understanding and agreed to proceed.   History of Present Illness:  10/05/20 ALL: Kaitlyn Kim is a 25 y.o. female here today for follow up.   10/02/19 ALL:  Kaitlyn Kim is a 25 y.o. female here today for follow up for seizures. She continues levetiracetam 500mg  twice daily. She is tolerating medications well with no obvious adverse effects. She continues to work full time. She is feeling well today and without complaints.   HISTORY: (copied from Dr note on 11/08/2017)  Interval history 11/08/2017: Very stable, no issues, no seizures, good compliance, doing great.She works at 11/10/2017.  Interval history 11/08/2016: Patient returns today for follow-up of seizures since age of 14. She has a history of Seizures and Generalized Tonic-Clonic Seizures. She Was Started on Keppra. Juvenile Myoclonic Epilepsy Is in the Differential. I Last Appointment We Continued Her on Keppra, Discussed Lamictal Is a Good Alternative. She is under stress. Doing well on Keppra no more mood swings. No seizures or staring spells, no jerking movements. We discussed Pradhan staying on Keppra. If she ever has any side effects to the Keppra or changes in mood we can switch to Lamictal. She reports some memory changes which are not uncommon on epilepsy medications we will monitor clinically.  03-16-1976 Kaitlyn Kim a 25 y.o.femalehere  as a referral from Dr. RCV:ELFYBO seizures. Patient has a past medical history of late onset absence seizures since the age of 15. She presented to [facility] February, earlier this month, not on any antiepileptics he medications after having episodes of seizures. She started making grunting noises followed by 20-30 seconds of tonic-clonic seizure. He was postictal afterwards. She appeared to bite her tongue and was having trouble breathing. Patient had been having morning seizures for over a month. Seizures started her at 82-19. She had been on Lamictal in the past with side effects. She was switched to Keppra have been off this medication for several years. She did see Dr. 14-15 in the past.She was restarted on Keppra. She is here with parents who also provide information. She had a [facility] in 2015, then January 2017 and then [facility] and in December started increasing in frequency. She screams, head back, eyes wide open, goes on for 1-2 minutes. She had 3 in a row and went to the ED. She is irritable on keppra.   Reviewed notes, labs and imaging from outside physicians, which showed:  Reviewed notes from pediatric neurology.Patient was diagnosed with SEIZURES AT THE AGE OF 11. She was trialed on multiple medications with side effects including Keppra caused aggressiveness, ethosuximide GI upset and panic, lamotrigine was also poorly tolerated.The patient had onset of seizures in 2008. EEG in January 18, 2008, showed spike and slow wave discharges of 3 Hz and bitemporal slowing. The patient had EEG on June 10, 2011, and on the basis of that, decision was made to taper and discontinue levetiracetam.     Observations/Objective:  Generalized: Well developed,  in no acute distress  Mentation: Alert oriented to time, place, history taking. Follows all commands speech and language fluent   Assessment and Plan:  25 y.o. year old female  has a past medical history of Seizures (HCC). here with  No  diagnosis found.  No orders of the defined types were placed in this encounter.   No orders of the defined types were placed in this encounter.    Follow Up Instructions:  I discussed the assessment and treatment plan with the patient. The patient was provided an opportunity to ask questions and all were answered. The patient agreed with the plan and demonstrated an understanding of the instructions.   The patient was advised to call back or seek an in-person evaluation if the symptoms worsen or if the condition fails to improve as anticipated.  I provided *** minutes of non-face-to-face time during this encounter. Patient located at their place of residence during Mychart visit. Provider is in the office.    Shawnie Dapper, NP

## 2020-10-06 ENCOUNTER — Telehealth: Payer: Self-pay | Admitting: Family Medicine

## 2020-10-06 ENCOUNTER — Telehealth: Payer: BC Managed Care – PPO | Admitting: Family Medicine

## 2020-10-06 DIAGNOSIS — G40B09 Juvenile myoclonic epilepsy, not intractable, without status epilepticus: Secondary | ICD-10-CM

## 2020-10-06 NOTE — Telephone Encounter (Signed)
Noted.  We see for seizures.

## 2020-10-06 NOTE — Telephone Encounter (Signed)
..   Pt understands that although there may be some limitations with this type of visit, we will take all precautions to reduce any security or privacy concerns.  Pt understands that this will be treated like an in office visit and we will file with pt's insurance, and there may be a patient responsible charge related to this service. ? ?

## 2020-10-12 DIAGNOSIS — Z3685 Encounter for antenatal screening for Streptococcus B: Secondary | ICD-10-CM | POA: Diagnosis not present

## 2020-10-16 LAB — OB RESULTS CONSOLE GBS: GBS: NEGATIVE

## 2020-11-11 ENCOUNTER — Other Ambulatory Visit: Payer: Self-pay

## 2020-11-11 ENCOUNTER — Encounter (HOSPITAL_COMMUNITY): Payer: Self-pay | Admitting: Obstetrics & Gynecology

## 2020-11-11 ENCOUNTER — Inpatient Hospital Stay (HOSPITAL_COMMUNITY)
Admission: AD | Admit: 2020-11-11 | Discharge: 2020-11-14 | DRG: 806 | Disposition: A | Discharge status: Home / Self Care | Payer: BC Managed Care – PPO | Attending: Obstetrics and Gynecology | Admitting: Obstetrics and Gynecology

## 2020-11-11 ENCOUNTER — Telehealth (HOSPITAL_COMMUNITY): Payer: Self-pay | Admitting: *Deleted

## 2020-11-11 DIAGNOSIS — Z3A40 40 weeks gestation of pregnancy: Secondary | ICD-10-CM | POA: Diagnosis not present

## 2020-11-11 DIAGNOSIS — G40909 Epilepsy, unspecified, not intractable, without status epilepticus: Secondary | ICD-10-CM | POA: Diagnosis not present

## 2020-11-11 DIAGNOSIS — O26893 Other specified pregnancy related conditions, third trimester: Secondary | ICD-10-CM | POA: Diagnosis not present

## 2020-11-11 DIAGNOSIS — Z3490 Encounter for supervision of normal pregnancy, unspecified, unspecified trimester: Secondary | ICD-10-CM

## 2020-11-11 DIAGNOSIS — Z23 Encounter for immunization: Secondary | ICD-10-CM | POA: Diagnosis not present

## 2020-11-11 DIAGNOSIS — O99354 Diseases of the nervous system complicating childbirth: Principal | ICD-10-CM | POA: Diagnosis present

## 2020-11-11 DIAGNOSIS — Z20822 Contact with and (suspected) exposure to covid-19: Secondary | ICD-10-CM | POA: Diagnosis present

## 2020-11-11 LAB — CBC
HCT: 38 % (ref 36.0–46.0)
Hemoglobin: 12.4 g/dL (ref 12.0–15.0)
MCH: 29.5 pg (ref 26.0–34.0)
MCHC: 32.6 g/dL (ref 30.0–36.0)
MCV: 90.5 fL (ref 80.0–100.0)
Platelets: 131 10*3/uL — ABNORMAL LOW (ref 150–400)
RBC: 4.2 MIL/uL (ref 3.87–5.11)
RDW: 15.2 % (ref 11.5–15.5)
WBC: 11.7 10*3/uL — ABNORMAL HIGH (ref 4.0–10.5)
nRBC: 0 % (ref 0.0–0.2)

## 2020-11-11 LAB — TYPE AND SCREEN
ABO/RH(D): O POS
Antibody Screen: NEGATIVE

## 2020-11-11 LAB — RESP PANEL BY RT-PCR (FLU A&B, COVID) ARPGX2
Influenza A by PCR: NEGATIVE
Influenza B by PCR: NEGATIVE
SARS Coronavirus 2 by RT PCR: NEGATIVE

## 2020-11-11 MED ORDER — LACTATED RINGERS IV SOLN: 500.0000 mL | INTRAVENOUS | Status: DC | PRN

## 2020-11-11 MED ORDER — LIDOCAINE HCL (PF) 1 % IJ SOLN: 30.0000 mL | INTRAMUSCULAR | Status: DC | PRN

## 2020-11-11 MED ORDER — LACTATED RINGERS IV SOLN: INTRAVENOUS | Status: DC

## 2020-11-11 MED ORDER — SOD CITRATE-CITRIC ACID 500-334 MG/5ML PO SOLN: 30.0000 mL | ORAL | Status: DC | PRN

## 2020-11-11 MED ORDER — OXYCODONE-ACETAMINOPHEN 5-325 MG PO TABS: 2.0000 | ORAL_TABLET | ORAL | Status: DC | PRN

## 2020-11-11 MED ORDER — FENTANYL CITRATE (PF) 100 MCG/2ML IJ SOLN
50.0000 ug | INTRAMUSCULAR | Status: DC | PRN
  Administered 2020-11-11: 100 ug via INTRAVENOUS
  Filled 2020-11-11: qty 2

## 2020-11-11 MED ORDER — OXYCODONE-ACETAMINOPHEN 5-325 MG PO TABS
1.0000 | ORAL_TABLET | ORAL | Status: DC | PRN
Start: 2020-11-11 — End: 2020-11-12

## 2020-11-11 MED ORDER — ACETAMINOPHEN 325 MG PO TABS: 650.0000 mg | ORAL_TABLET | ORAL | Status: DC | PRN

## 2020-11-11 MED ORDER — ONDANSETRON HCL 4 MG/2ML IJ SOLN
4.0000 mg | Freq: Four times a day (QID) | INTRAMUSCULAR | Status: DC | PRN
  Administered 2020-11-12: 4 mg via INTRAVENOUS
  Filled 2020-11-11: qty 2

## 2020-11-11 MED ORDER — OXYTOCIN BOLUS FROM INFUSION
333.0000 mL | Freq: Once | INTRAVENOUS | Status: AC
  Administered 2020-11-12: 333 mL via INTRAVENOUS

## 2020-11-11 MED ORDER — OXYTOCIN-SODIUM CHLORIDE 30-0.9 UT/500ML-% IV SOLN: 2.5000 [IU]/h | INTRAVENOUS | Status: DC

## 2020-11-11 NOTE — Telephone Encounter (Signed)
Preadmission screen  

## 2020-11-11 NOTE — MAU Note (Signed)
Pt states she's been contracting since early this morning. 5 mins apart. Denies LOF, VB.+FM. Rating pain 8/10. Was 1 cm at prenatal visit yesterday.

## 2020-11-12 ENCOUNTER — Encounter (HOSPITAL_COMMUNITY): Payer: Self-pay | Admitting: Obstetrics & Gynecology

## 2020-11-12 ENCOUNTER — Inpatient Hospital Stay (HOSPITAL_COMMUNITY): Payer: BC Managed Care – PPO | Admitting: Anesthesiology

## 2020-11-12 ENCOUNTER — Encounter (HOSPITAL_COMMUNITY): Payer: Self-pay | Admitting: Anesthesiology

## 2020-11-12 LAB — RPR: RPR Ser Ql: NONREACTIVE

## 2020-11-12 MED ORDER — MEASLES, MUMPS & RUBELLA VAC IJ SOLR: 0.5000 mL | Freq: Once | INTRAMUSCULAR | Status: DC

## 2020-11-12 MED ORDER — TERBUTALINE SULFATE 1 MG/ML IJ SOLN: 0.2500 mg | Freq: Once | INTRAMUSCULAR | Status: DC | PRN

## 2020-11-12 MED ORDER — SIMETHICONE 80 MG PO CHEW: 80.0000 mg | CHEWABLE_TABLET | ORAL | Status: DC | PRN

## 2020-11-12 MED ORDER — ZOLPIDEM TARTRATE 5 MG PO TABS: 5.0000 mg | ORAL_TABLET | Freq: Every evening | ORAL | Status: DC | PRN

## 2020-11-12 MED ORDER — DIPHENHYDRAMINE HCL 50 MG/ML IJ SOLN: 12.5000 mg | INTRAMUSCULAR | Status: DC | PRN

## 2020-11-12 MED ORDER — EPHEDRINE 5 MG/ML INJ: 10.0000 mg | INTRAVENOUS | Status: DC | PRN

## 2020-11-12 MED ORDER — DIBUCAINE (PERIANAL) 1 % EX OINT: 1.0000 "application " | TOPICAL_OINTMENT | CUTANEOUS | Status: DC | PRN

## 2020-11-12 MED ORDER — TETANUS-DIPHTH-ACELL PERTUSSIS 5-2.5-18.5 LF-MCG/0.5 IM SUSY: 0.5000 mL | PREFILLED_SYRINGE | Freq: Once | INTRAMUSCULAR | Status: DC

## 2020-11-12 MED ORDER — OXYTOCIN-SODIUM CHLORIDE 30-0.9 UT/500ML-% IV SOLN
1.0000 m[IU]/min | INTRAVENOUS | Status: DC
  Administered 2020-11-12: 2 m[IU]/min via INTRAVENOUS
  Filled 2020-11-12: qty 500

## 2020-11-12 MED ORDER — MEDROXYPROGESTERONE ACETATE 150 MG/ML IM SUSP: 150.0000 mg | INTRAMUSCULAR | Status: DC | PRN

## 2020-11-12 MED ORDER — LEVETIRACETAM 500 MG PO TABS
500.0000 mg | ORAL_TABLET | Freq: Two times a day (BID) | ORAL | Status: DC
  Administered 2020-11-12 – 2020-11-14 (×4): 500 mg via ORAL
  Filled 2020-11-12 (×5): qty 1

## 2020-11-12 MED ORDER — ONDANSETRON HCL 4 MG PO TABS: 4.0000 mg | ORAL_TABLET | ORAL | Status: DC | PRN

## 2020-11-12 MED ORDER — COCONUT OIL OIL: 1.0000 "application " | TOPICAL_OIL | Status: DC | PRN

## 2020-11-12 MED ORDER — LEVETIRACETAM 500 MG PO TABS
500.0000 mg | ORAL_TABLET | Freq: Two times a day (BID) | ORAL | Status: DC
  Administered 2020-11-12: 500 mg via ORAL
  Filled 2020-11-12 (×3): qty 1

## 2020-11-12 MED ORDER — OXYCODONE-ACETAMINOPHEN 5-325 MG PO TABS: 1.0000 | ORAL_TABLET | ORAL | Status: DC | PRN

## 2020-11-12 MED ORDER — ACETAMINOPHEN 325 MG PO TABS
650.0000 mg | ORAL_TABLET | ORAL | Status: DC | PRN
  Administered 2020-11-12 – 2020-11-13 (×2): 650 mg via ORAL
  Filled 2020-11-12 (×2): qty 2

## 2020-11-12 MED ORDER — WITCH HAZEL-GLYCERIN EX PADS
1.0000 | MEDICATED_PAD | CUTANEOUS | Status: DC | PRN
Start: 2020-11-12 — End: 2020-11-14

## 2020-11-12 MED ORDER — PRENATAL MULTIVITAMIN CH
1.0000 | ORAL_TABLET | Freq: Every day | ORAL | Status: DC
  Administered 2020-11-12 – 2020-11-13 (×2): 1 via ORAL
  Filled 2020-11-12 (×2): qty 1

## 2020-11-12 MED ORDER — EPHEDRINE 5 MG/ML INJ
10.0000 mg | INTRAVENOUS | Status: DC | PRN
  Administered 2020-11-12: 10 mg via INTRAVENOUS
  Filled 2020-11-12: qty 10

## 2020-11-12 MED ORDER — LACTATED RINGERS IV SOLN: 500.0000 mL | Freq: Once | INTRAVENOUS | Status: DC

## 2020-11-12 MED ORDER — CEFAZOLIN SODIUM-DEXTROSE 2-4 GM/100ML-% IV SOLN
2.0000 g | Freq: Once | INTRAVENOUS | Status: AC
  Administered 2020-11-12: 2 g via INTRAVENOUS
  Filled 2020-11-12: qty 100

## 2020-11-12 MED ORDER — IBUPROFEN 600 MG PO TABS
600.0000 mg | ORAL_TABLET | Freq: Four times a day (QID) | ORAL | Status: DC
  Administered 2020-11-12 – 2020-11-14 (×8): 600 mg via ORAL
  Filled 2020-11-12 (×7): qty 1

## 2020-11-12 MED ORDER — OXYCODONE-ACETAMINOPHEN 5-325 MG PO TABS: 2.0000 | ORAL_TABLET | ORAL | Status: DC | PRN

## 2020-11-12 MED ORDER — ONDANSETRON HCL 4 MG/2ML IJ SOLN: 4.0000 mg | INTRAMUSCULAR | Status: DC | PRN

## 2020-11-12 MED ORDER — FENTANYL-BUPIVACAINE-NACL 0.5-0.125-0.9 MG/250ML-% EP SOLN
12.0000 mL/h | EPIDURAL | Status: DC | PRN
  Administered 2020-11-12: 12 mL/h via EPIDURAL
  Filled 2020-11-12: qty 250

## 2020-11-12 MED ORDER — BENZOCAINE-MENTHOL 20-0.5 % EX AERO
1.0000 "application " | INHALATION_SPRAY | CUTANEOUS | Status: DC | PRN
  Administered 2020-11-12: 1 via TOPICAL
  Filled 2020-11-12: qty 56

## 2020-11-12 MED ORDER — DIPHENHYDRAMINE HCL 25 MG PO CAPS: 25.0000 mg | ORAL_CAPSULE | Freq: Four times a day (QID) | ORAL | Status: DC | PRN

## 2020-11-12 MED ORDER — PHENYLEPHRINE 40 MCG/ML (10ML) SYRINGE FOR IV PUSH (FOR BLOOD PRESSURE SUPPORT): 80.0000 ug | PREFILLED_SYRINGE | INTRAVENOUS | Status: DC | PRN

## 2020-11-12 MED ORDER — SENNOSIDES-DOCUSATE SODIUM 8.6-50 MG PO TABS
2.0000 | ORAL_TABLET | Freq: Every day | ORAL | Status: DC
  Administered 2020-11-14: 2 via ORAL
  Filled 2020-11-12 (×2): qty 2

## 2020-11-12 MED ORDER — LIDOCAINE HCL (PF) 1 % IJ SOLN
INTRAMUSCULAR | Status: DC | PRN
  Administered 2020-11-12: 8 mL via EPIDURAL

## 2020-11-12 MED ORDER — FENTANYL-BUPIVACAINE-NACL 0.5-0.125-0.9 MG/250ML-% EP SOLN: 12.0000 mL/h | EPIDURAL | Status: DC | PRN

## 2020-11-12 NOTE — Anesthesia Postprocedure Evaluation (Signed)
Anesthesia Post Note  Patient: Kaitlyn Kim  Procedure(s) Performed: AN AD HOC LABOR EPIDURAL     Patient location during evaluation: Mother Baby Anesthesia Type: Epidural Level of consciousness: awake and alert Pain management: pain level controlled Vital Signs Assessment: post-procedure vital signs reviewed and stable Respiratory status: spontaneous breathing, nonlabored ventilation and respiratory function stable Cardiovascular status: stable Postop Assessment: no headache, no backache and epidural receding Anesthetic complications: no   No complications documented.  Last Vitals:  Vitals:   11/12/20 1231 11/12/20 1344  BP: (!) 108/53 117/80  Pulse: (!) 115 (!) 106  Resp:  18  Temp:  37.4 C  SpO2:  98%    Last Pain:  Vitals:   11/12/20 1344  TempSrc: Oral  PainSc: 2    Pain Goal:                   Rica Records

## 2020-11-12 NOTE — H&P (Signed)
Kaitlyn Kim is a 25 y.o. female G1 at [redacted]w[redacted]d presenting for augmentation of labor.  Patient presented to MAU labor evaluation and did not change from 1 cm.  She was having CTX q 2-3 minutes and was offered augmentation of labor which she accepted.  Active FM.  No LOF or VB.  Antepartum course complicated by seizure d/o; takes Keppra 500 BID.  GBS negative.  OB History    Gravida  1   Para      Term      Preterm      AB      Living        SAB      IAB      Ectopic      Multiple      Live Births             Past Medical History:  Diagnosis Date  . Seizures (HCC)    HX of seizures 3 years ago   Past Surgical History:  Procedure Laterality Date  . LACERATION REPAIR  1998   forehead  . NO PAST SURGERIES     Family History: family history includes ALS in her maternal grandmother; COPD in her paternal grandfather and paternal grandmother; Cancer in her paternal grandmother; Hyperlipidemia in her mother; Hypertension in her maternal grandmother; Thyroid disease in her mother. Social History:  reports that she has never smoked. She has never used smokeless tobacco. She reports previous alcohol use. She reports that she does not use drugs.     Maternal Diabetes: No Genetic Screening: Normal Maternal Ultrasounds/Referrals: Normal Fetal Ultrasounds or other Referrals:  None Maternal Substance Abuse:  No Significant Maternal Medications:  Meds include: Other: Keppra Significant Maternal Lab Results:  Group B Strep negative Other Comments:  None  Review of Systems Maternal Medical History:  Reason for admission: Contractions.   Contractions: Onset was more than 2 days ago.   Frequency: regular.   Perceived severity is moderate.    Fetal activity: Perceived fetal activity is normal.   Last perceived fetal movement was within the past hour.    Prenatal complications: no prenatal complications Prenatal Complications - Diabetes: none.    Dilation: 1 Effacement  (%): 90 Station: -2 Exam by:: Dr. Langston Masker Blood pressure 117/75, pulse 92, temperature 98.5 F (36.9 C), temperature source Oral, resp. rate 20, height 5\' 4"  (1.626 m), weight 73.5 kg, SpO2 100 %. Maternal Exam:  Uterine Assessment: Contraction strength is moderate.  Contraction frequency is regular.   Abdomen: Patient reports no abdominal tenderness. Fundal height is c/w dates.   Estimated fetal weight is 7##8.   Fetal presentation: vertex  Introitus: Normal vulva. Pelvis: adequate for delivery.   Cervix: Cervix evaluated by digital exam.     Fetal Exam Fetal Monitor Review: Baseline rate: 135.  Variability: moderate (6-25 bpm).   Pattern: accelerations present and no decelerations.    Fetal State Assessment: Category I - tracings are normal.     Physical Exam Constitutional:      Appearance: Normal appearance.  HENT:     Head: Normocephalic and atraumatic.  Pulmonary:     Effort: Pulmonary effort is normal.  Abdominal:     Palpations: Abdomen is soft.  Genitourinary:    General: Normal vulva.  Musculoskeletal:        General: Normal range of motion.     Cervical back: Normal range of motion.  Skin:    General: Skin is warm and dry.  Neurological:  Mental Status: She is alert and oriented to person, place, and time.  Psychiatric:        Mood and Affect: Mood normal.        Behavior: Behavior normal.     Prenatal labs: ABO, Rh: --/--/O POS (06/01 2210) Antibody: NEG (06/01 2210) Rubella: Immune (10/20 0000) RPR: Nonreactive (10/20 0000)  HBsAg: Negative (10/20 0000)  HIV: Non-reactive (10/20 0000)  GBS:   Negative  Assessment/Plan: 24yo G1 at [redacted]w[redacted]d for augmentation of labor -Patient requests CLEA -Start pitocin  -Continue Keppra -Anticipate NSVD   Mitchel Honour 11/12/2020, 12:34 AM

## 2020-11-12 NOTE — Progress Notes (Signed)
Kaitlyn Kim is a 25 y.o. G1P0 at [redacted]w[redacted]d by ultrasound admitted for augmentation of labor  Subjective: Comfortable with CLEA  Objective: BP (!) 90/44   Pulse 86   Temp 98.5 F (36.9 C) (Oral)   Resp 20   Ht 5\' 4"  (1.626 m)   Wt 73.5 kg   SpO2 100%   BMI 27.81 kg/m  No intake/output data recorded. No intake/output data recorded.  FHT:  FHR: 120 bpm, variability: moderate,  accelerations:  Present,  decelerations:  Absent UC:   regular, every 2-4 minutes SVE:   Dilation: 3 Effacement (%): 90 Station: -1 Exam by:: Dr. [email or web address]  Labs: Lab Results  Component Value Date   WBC 11.7 (H) 11/11/2020   HGB 12.4 11/11/2020   HCT 38.0 11/11/2020   MCV 90.5 11/11/2020   PLT 131 (L) 11/11/2020    Assessment / Plan: Augmentation of labor, progressing well  Labor: Progressing normally with pitocin Preeclampsia:  n/a Fetal Wellbeing:  Category I Pain Control:  Epidural I/D:  n/a Anticipated MOD:  NSVD  01/11/2021 11/12/2020, 3:48 AM

## 2020-11-12 NOTE — Lactation Note (Signed)
This note was copied from a baby's chart. Lactation Consultation Note  Patient Name: Kaitlyn Kim [id number] Date: 11/12/2020 Reason for consult: L&D Initial assessment;Term Age: LC visit at 45 mins of age,Baby STS on moms chest, fussy , and rooting. LC offered to assist and mom receptive for assistance.  1st latch - left breast / after several attempts and latched for 10 mins , baby released and switched to the right breast and latched for 8 mins / released on her own / nipple well rounded. Latch 7 and 8 x2  Per mom has had sensitive nipples throughout the pregnancy. LC noted compressible areolas with some edema at the base / more on the left than the right.  Due to the nipple sensitivity , - LC recommended when she is on MBU - to pre -pump with a hand pump to stretch the nipple / areola complex as preventive measure. LC gave report to the [facility] Beth Delfava - and asked her to provide a hand pump.  Per LD RN - baby's 1st temp was 101.9 and mom had a temp after delivery too.  Baby just STS / no blankets.  Grandmother present at consult.  Maternal Data Has patient been taught Hand Expression?: Yes Does the patient have breastfeeding experience prior to this delivery?: No  Feeding Mother's Current Feeding Choice: Breast Milk  LATCH Score Latch: Grasps breast easily, tongue down, lips flanged, rhythmical sucking.  Audible Swallowing: A few with stimulation  Type of Nipple: Everted at rest and after stimulation  Comfort (Breast/Nipple): Soft / non-tender  Hold (Positioning): Assistance needed to correctly position infant at breast and maintain latch.  LATCH Score: 8   Lactation Tools Discussed/Used    Interventions Interventions: Breast feeding basics reviewed;Assisted with latch;Breast massage;Breast compression;Adjust position;Support pillows  Discharge    Consult Status Consult Status: Follow-up Date: 11/12/20 Follow-up type: In-patient    [name] [name] 11/12/2020,  12:02 PM

## 2020-11-12 NOTE — Anesthesia Preprocedure Evaluation (Signed)
Anesthesia Evaluation  Patient identified by MRN, date of birth, ID band Patient awake    Reviewed: Allergy & Precautions, H&P , NPO status , Patient's Chart, lab work & pertinent test results, reviewed documented beta blocker date and time   Airway Mallampati: I  TM Distance: >3 FB Neck ROM: full    Dental no notable dental hx. (+) Teeth Intact, Dental Advisory Given   Pulmonary neg pulmonary ROS,    Pulmonary exam normal breath sounds clear to auscultation       Cardiovascular negative cardio ROS Normal cardiovascular exam Rhythm:regular Rate:Normal     Neuro/Psych Seizures -, Well Controlled,  negative psych ROS   GI/Hepatic negative GI ROS, Neg liver ROS,   Endo/Other  negative endocrine ROS  Renal/GU negative Renal ROS  negative genitourinary   Musculoskeletal   Abdominal   Peds  Hematology negative hematology ROS (+)   Anesthesia Other Findings   Reproductive/Obstetrics (+) Pregnancy                             Anesthesia Physical Anesthesia Plan  ASA: II  Anesthesia Plan: Epidural   Post-op Pain Management:    Induction:   PONV Risk Score and Plan:   Airway Management Planned: Natural Airway  Additional Equipment:   Intra-op Plan:   Post-operative Plan:   Informed Consent: I have reviewed the patients History and Physical, chart, labs and discussed the procedure including the risks, benefits and alternatives for the proposed anesthesia with the patient or authorized representative who has indicated his/her understanding and acceptance.     Dental Advisory Given  Plan Discussed with: Anesthesiologist  Anesthesia Plan Comments: (Labs checked- platelets confirmed with RN in room. Fetal heart tracing, per RN, reported to be stable enough for sitting procedure. Discussed epidural, and patient consents to the procedure:  included risk of possible headache,backache,  failed block, allergic reaction, and nerve injury. This patient was asked if she had any questions or concerns before the procedure started.)        Anesthesia Quick Evaluation

## 2020-11-12 NOTE — Progress Notes (Signed)
Kerriann Kamphuis is a 25 y.o. G1P0 at [redacted]w[redacted]d by ultrasound admitted for induction of labor due to prolonged latent labor.  Subjective:   Objective: BP (!) 105/50   Pulse 96   Temp 98.1 F (36.7 C) (Oral)   Resp 18   Ht 5\' 4"  (1.626 m)   Wt 73.5 kg   SpO2 98%   BMI 27.81 kg/m  No intake/output data recorded. No intake/output data recorded.  FHT:  FHR: 140 bpm, variability: moderate,  accelerations:  Present,  decelerations:  Absent UC:   none, regular, every 2 minutes SVE:   Dilation: 10 Effacement (%): 100 Station: 0,Plus 1 Exam by:: m wilkins rnc  Labs: Lab Results  Component Value Date   WBC 11.7 (H) 11/11/2020   HGB 12.4 11/11/2020   HCT 38.0 11/11/2020   MCV 90.5 11/11/2020   PLT 131 (L) 11/11/2020    Assessment / Plan: Spontaneous labor, progressing normally  Labor: Progressing normally Preeclampsia:  no signs or symptoms of toxicity Fetal Wellbeing:  Category I Pain Control:  Epidural I/D:  n/a Anticipated MOD:  NSVD  01/11/2021 11/12/2020, 9:11 AM

## 2020-11-12 NOTE — Lactation Note (Signed)
This note was copied from a baby's chart. Lactation Consultation Note  Patient Name: Kaitlyn Kim [id number] Date: 11/12/2020 Reason for consult: Follow-up assessment Age:27 hours   LC Follow Up Visit:  RN requested latch assistance.  At the time of her request I was assisting another mother.  When I arrived, RN had assisted baby to latch in the football hold on the right breast.  RN reported that it took a little bit of time to obtain a good deep latch.  Baby appeared to be latched deeply with a wide gape and flanged lips.  Mother initially felt pain but stated that after baby began sucking more the pain eased.  Thanked Charity fundraiser and mother for a job well done with latching.  Spoke with mother on how to know when baby is done feeding at the breast.  Gentle stimulation and breast compressions demonstrated.  RN will provide comfort gels and breast shells for mother to use after feeding.  Encouraged mother to call for assistance during the night as needed.   Maternal Data Has patient been taught Hand Expression?: Yes Does the patient have breastfeeding experience prior to this delivery?: No  Feeding Mother's Current Feeding Choice: Breast Milk  LATCH Score Latch: Repeated attempts needed to sustain latch, nipple held in mouth throughout feeding, stimulation needed to elicit sucking reflex.  Audible Swallowing: A few with stimulation  Type of Nipple: Everted at rest and after stimulation  Comfort (Breast/Nipple): Filling, red/small blisters or bruises, mild/mod discomfort  Hold (Positioning): Assistance needed to correctly position infant at breast and maintain latch.  LATCH Score: 6   Lactation Tools Discussed/Used    Interventions Interventions: Breast feeding basics reviewed  Discharge Pump: Personal WIC Program: No  Consult Status Consult Status: Follow-up Date: 11/13/20 Follow-up type: In-patient    Dora Sims 11/12/2020, 6:48 PM

## 2020-11-12 NOTE — Lactation Note (Signed)
This note was copied from a baby's chart. Lactation Consultation Note  Patient Name: Kaitlyn Kim [id number] Date: 11/12/2020 Reason for consult: Initial assessment;Primapara;1st time breastfeeding;Term Age:27 hours   P1 mother whose infant is now 56 hours old.  This is a term baby at 40+2 weeks.  Baby was asleep in grandmother's arms when I arrived.  Reviewed breast feeding basics with mother.  Encouraged lots of STS and hand expression.  Mother was able to hand express colostrum drops which she finger fed back to baby.  Mother will feed 8-12 times/24 hours or sooner if baby shows feeding cues.  Reviewed cues.  Per previous [facility] consult in L&D, it was recommended to provide a manual pump for mother.  RN brought manual pump and I reviewed pump use.  #24 flange size is the appropriate size at this time.    Mom made aware of O/P services, breastfeeding support groups, community resources, and our phone # for post-discharge questions.  Mother has 16 weeks off work and will return the end of September.  She has a DEBP for home use.  Her mother is a good support.  Reassured mother that lactation services will be provided as needed.  Encouraged her to call for latch assistance.  Mother verbalized understanding.   Maternal Data Has patient been taught Hand Expression?: Yes Does the patient have breastfeeding experience prior to this delivery?: No  Feeding Mother's Current Feeding Choice: Breast Milk  LATCH Score Latch: Grasps breast easily, tongue down, lips flanged, rhythmical sucking.  Audible Swallowing: A few with stimulation  Type of Nipple: Everted at rest and after stimulation  Comfort (Breast/Nipple): Soft / non-tender  Hold (Positioning): Assistance needed to correctly position infant at breast and maintain latch.  LATCH Score: 8   Lactation Tools Discussed/Used    Interventions Interventions: Breast feeding basics reviewed  Discharge Pump: Personal WIC Program:  No  Consult Status Consult Status: Follow-up Date: 11/13/20 Follow-up type: In-patient    Fleet Higham R Ambria Mayfield 11/12/2020, 3:21 PM

## 2020-11-12 NOTE — Anesthesia Procedure Notes (Signed)
Epidural Patient location during procedure: OB Start time: 11/12/2020 12:53 AM End time: 11/12/2020 1:00 AM  Staffing Anesthesiologist: Bethena Midget, MD  Preanesthetic Checklist Completed: patient identified, IV checked, site marked, risks and benefits discussed, surgical consent, monitors and equipment checked, pre-op evaluation and timeout performed  Epidural Patient position: sitting Prep: DuraPrep and site prepped and draped Patient monitoring: continuous pulse ox and blood pressure Approach: midline Location: L3-L4 Injection technique: LOR air  Needle:  Needle type: Tuohy  Needle gauge: 17 G Needle length: 9 cm and 9 Needle insertion depth: 5 cm cm Catheter type: closed end flexible Catheter size: 19 Gauge Catheter at skin depth: 10 cm Test dose: negative  Assessment Events: blood not aspirated, injection not painful, no injection resistance, no paresthesia and negative IV test

## 2020-11-13 LAB — CBC
HCT: 32.7 % — ABNORMAL LOW (ref 36.0–46.0)
Hemoglobin: 10.7 g/dL — ABNORMAL LOW (ref 12.0–15.0)
MCH: 29.4 pg (ref 26.0–34.0)
MCHC: 32.7 g/dL (ref 30.0–36.0)
MCV: 89.8 fL (ref 80.0–100.0)
Platelets: 134 10*3/uL — ABNORMAL LOW (ref 150–400)
RBC: 3.64 MIL/uL — ABNORMAL LOW (ref 3.87–5.11)
RDW: 15.4 % (ref 11.5–15.5)
WBC: 14.3 10*3/uL — ABNORMAL HIGH (ref 4.0–10.5)
nRBC: 0 % (ref 0.0–0.2)

## 2020-11-13 NOTE — Lactation Note (Signed)
This note was copied from a baby's chart. Lactation Consultation Note  Patient Name: Kaitlyn Kim [id number] Date: 11/13/2020 Reason for consult: Follow-up assessment;Mother's request;Difficult latch;1st time breastfeeding;Term Age:27 hours P1, term female infant with 4 stools. Per mom, she experienced breast pain monthly with her menstrual cycle prior to her pregnancy and she  has sensitive breast that are pseudo inverted .  Mom has pseudo -inverted nipples, per mom,  she is experienced soreness when infant is  latch, infant has strong tug with latch. Mom latched infant on her left breast using the  football hold position, top lip was flanged outward, swallows observed, infant latched with depth  infant BF for 12 minutes. While infant was latched on mom's left breast, LC used hand pump on mom's right breast and per mom," they felt exactly the same", it was no difference with infant latch and use of  hand pump on the opposite side of the breast.  Mom's plan: 1- Mom will pre-pump breast prior to latching infant at the breast. 2- Continue to breastfeed infant according to cues, 8 to 12+ times, STS. 3- Due nipple soreness mom will wear comfort gels in bra after infant latches at breast. 4- During the day mom will wear breast shells to help evert nipple shaft out more.  5- Mom knows to call RN or LC for latch assistance if needed. Maternal Data    Feeding Mother's Current Feeding Choice: Breast Milk  LATCH Score Latch: Grasps breast easily, tongue down, lips flanged, rhythmical sucking.  Audible Swallowing: A few with stimulation  Type of Nipple: Inverted  Comfort (Breast/Nipple): Soft / non-tender  Hold (Positioning): Assistance needed to correctly position infant at breast and maintain latch.  LATCH Score: 6   Lactation Tools Discussed/Used Tools: Shells;Pump Breast pump type: Manual Reason for Pumping: Mom will pre-pump to helo evert nipple shaft out more prior to latching  infant at the breast.  Interventions Interventions: Assisted with latch;Skin to skin;Pre-pump if needed;Breast compression;Adjust position;Support pillows;Position options;Comfort gels;Hand pump;Education;Shells  Discharge Pump: Manual  Consult Status Consult Status: Follow-up Date: 11/13/20 Follow-up type: In-patient    Danelle Earthly 11/13/2020, 12:13 AM

## 2020-11-13 NOTE — Discharge Summary (Signed)
Postpartum Discharge Summary  Date of Service November 13, 2020     Patient Name: Kaitlyn Kim DOB: [birth year] MRN: [id number]  Date of admission: 11/11/2020 Delivery date:11/12/2020  Delivering provider: Louretta Shorten  Date of discharge: 11/13/2020  Admitting diagnosis: Pregnancy [Z34.90] Intrauterine pregnancy: [redacted]w[redacted]d    Secondary diagnosis:  Active Problems:   Pregnancy  Additional problems: none    Discharge diagnosis: Term Pregnancy Delivered                                              Post partum procedures:none Augmentation: AROM and Pitocin Complications: None  Hospital course: Onset of Labor With Vaginal Delivery      25y.o. yo G1P1001 at 448w2das admitted in Latent Labor on 11/11/2020. Patient had an uncomplicated labor course as follows:  Membrane Rupture Time/Date: 7:40 AM ,11/12/2020   Delivery Method:Vaginal, Spontaneous  Episiotomy: None  Lacerations:  2nd degree  Patient had an uncomplicated postpartum course.  She is ambulating, tolerating a regular diet, passing flatus, and urinating well. Patient is discharged home in stable condition on 11/13/20.  Newborn Data: Birth date:11/12/2020  Birth time:10:59 AM  Gender:Female  Living status:Living  Apgars:9 ,9  Weight:3861 g   Magnesium Sulfate received: No BMZ received: No Rhophylac:No MMR:No T-DaP:Given prenatally Flu: No Transfusion:No  Physical exam  Vitals:   11/12/20 1820 11/12/20 2200 11/13/20 0215 11/13/20 0600  BP: 119/81 106/68 106/81 107/77  Pulse: 84 92 90 97  Resp: '18 17 16 17  ' Temp: 98.7 F (37.1 C) 98.4 F (36.9 C) 98.1 F (36.7 C) 98 F (36.7 C)  TempSrc: Oral Oral Oral Oral  SpO2: 98% 98% 100% 99%  Weight:      Height:       General: alert, cooperative and no distress Lochia: appropriate Uterine Fundus: firm Incision: Healing well with no significant drainage DVT Evaluation: No evidence of DVT seen on physical exam. Labs: Lab Results  Component Value Date   WBC 14.3 (H)  11/13/2020   HGB 10.7 (L) 11/13/2020   HCT 32.7 (L) 11/13/2020   MCV 89.8 11/13/2020   PLT 134 (L) 11/13/2020   CMP Latest Ref Rng & Units 04/07/2020  Glucose 70 - 99 mg/dL 72  BUN 6 - 20 mg/dL 9  Creatinine 0.44 - 1.00 mg/dL 0.53  Sodium 135 - 145 mmol/L 133(L)  Potassium 3.5 - 5.1 mmol/L 4.7  Chloride 98 - 111 mmol/L 103  CO2 22 - 32 mmol/L 15(L)  Calcium 8.9 - 10.3 mg/dL 9.5  Total Protein 6.5 - 8.1 g/dL 6.7  Total Bilirubin 0.3 - 1.2 mg/dL 1.4(H)  Alkaline Phos 38 - 126 U/L 50  AST 15 - 41 U/L 71(H)  ALT 0 - 44 U/L 100(H)   EdFlavia Shippercore: Edinburgh Postnatal Depression Scale Screening Tool 11/12/2020  I have been able to laugh and see the funny side of things. 0  I have looked forward with enjoyment to things. 0  I have blamed myself unnecessarily when things went wrong. 2  I have been anxious or worried for no good reason. 2  I have felt scared or panicky for no good reason. 2  Things have been getting on top of me. 1  I have been so unhappy that I have had difficulty sleeping. 0  I have felt sad or miserable. 1  I have been so  unhappy that I have been crying. 1  The thought of harming myself has occurred to me. 0  Edinburgh Postnatal Depression Scale Total 9      After visit meds:  Allergies as of 11/13/2020   No Known Allergies     Medication List    TAKE these medications   diclofenac Sodium 1 % Gel Commonly known as: Voltaren Apply 2 g topically 4 (four) times daily.   famotidine 20 MG tablet Commonly known as: Pepcid Take 1 tablet (20 mg total) by mouth daily.   levETIRAcetam 500 MG tablet Commonly known as: KEPPRA Take 1 tablet (500 mg total) by mouth 2 (two) times daily.   nitrofurantoin (macrocrystal-monohydrate) 100 MG capsule Commonly known as: Macrobid Take 1 capsule (100 mg total) by mouth 2 (two) times daily.   ondansetron 4 MG tablet Commonly known as: ZOFRAN Take 4 mg by mouth every 8 (eight) hours as needed for nausea or vomiting.         Discharge home in stable condition Infant Feeding: Breast Infant Disposition:home with mother Discharge instruction: per After Visit Summary and Postpartum booklet. Activity: Advance as tolerated. Pelvic rest for 6 weeks.  Diet: routine diet Anticipated Birth Control: Unsure Postpartum Appointment:6 weeks Additional Postpartum F/U: none Future Appointments: Future Appointments  Date Time Provider [employer]  11/16/2020 10:10 AM [facility]-SCREENING [facility] None  12/29/2020 10:30 AM Debbora Presto, NP [facility] None   Follow up Visit:      11/13/2020 Cyril Mourning, MD

## 2020-11-13 NOTE — Social Work (Signed)
CSW received and acknowledges consult for EDPS of 9. Consult screened out due to 9 on EDPS does not warrant a CSW consult.  MOB whom scores are greater than 9/yes to question 10 on Edinburgh Postpartum Depression Screen warrants a CSW consult.   Karima Carrell, MSW, LCSW [facility]  Clinical Social Worker  [phone] 11/13/2020  8:50 AM 

## 2020-11-13 NOTE — Lactation Note (Signed)
This note was copied from a baby's chart. Lactation Consultation Note  Patient Name: Girl Nyoka Alcoser MCNOB'S Date: 11/13/2020 Reason for consult: Follow-up assessment Age:27 Hours Mother attempting to latch infant when LC arrived to room. GM assisting mother.  Infant latched and mother reports that she felt pain. Observed that infants lips needed flanging. Infants lips flange but top lip is tight. Observed infant with chewing chomping pattern. Mother reports that it feels like infant is biting her. Mother describes a pain scale of a #6 with latch. Several attempts to readjust infants lips for wider gape.   With release of breast , mother nipple compressed with lower half of nipple flat. Observed that infant has a slight upper lip tie ,a short tight lingual frenulum as well as a high palate.  When hand expressed no observed colostrum from mothers breast. Infant placed on alternate breast . Observed same suckling pattern.  No obs milk transfer. Mother reports that Lt nipple is mother tender than the RT. Mother has comfort gels. Assist with hand expression and use of hand pump. Unable to obtain and colostrum. Support given to mother .  Mother was informed of available DBM as an option to offer infant  Supplement.  Mother to use her hand pump for 15 mins on each breast after each feeding attempt.     Maternal Data    Feeding Mother's Current Feeding Choice: Breast Milk  LATCH Score Latch: Repeated attempts needed to sustain latch, nipple held in mouth throughout feeding, stimulation needed to elicit sucking reflex.  Audible Swallowing: A few with stimulation  Type of Nipple: Everted at rest and after stimulation  Comfort (Breast/Nipple): Engorged, cracked, bleeding, large blisters, severe discomfort (obs pinch compression accross nipple)  Hold (Positioning): Assistance needed to correctly position infant at breast and maintain latch.  LATCH Score: 5   Lactation Tools  Discussed/Used Breast pump type: Manual Reason for Pumping: post pump to obtain any amts of colostrum, infant poor suck rhythm  Interventions Interventions: Breast feeding basics reviewed;Assisted with latch;Skin to skin;Hand express;Breast compression;Adjust position;Support pillows;Position options;Hand pump;Comfort gels;Shells  Discharge    Consult Status Consult Status: Follow-up Date: 11/13/20 Follow-up type: In-patient    Stevan Born [facility] 11/13/2020, 11:46 AM

## 2020-11-14 NOTE — Discharge Summary (Signed)
Postpartum Discharge Summary  Date of Service November 13, 2020     Patient Name: Kaitlyn Kim DOB: [birth year] MRN: [id number]  Date of admission: 11/11/2020 Delivery date:11/12/2020  Delivering provider: Louretta Shorten  Date of discharge: 11/14/2020  Admitting diagnosis: Pregnancy [Z34.90] Intrauterine pregnancy: [redacted]w[redacted]d    Secondary diagnosis:  Active Problems:   Pregnancy  Additional problems: none    Discharge diagnosis: Term Pregnancy Delivered                                              Post partum procedures:none Augmentation: AROM and Pitocin Complications: None  Hospital course: Onset of Labor With Vaginal Delivery      25y.o. yo G1P1001 at 443w2das admitted in Latent Labor on 11/11/2020. Patient had an uncomplicated labor course as follows:  Membrane Rupture Time/Date: 7:40 AM ,11/12/2020   Delivery Method:Vaginal, Spontaneous  Episiotomy: None  Lacerations:  2nd degree  Patient had an uncomplicated postpartum course.  She is ambulating, tolerating a regular diet, passing flatus, and urinating well. Patient is discharged home in stable condition on 11/14/20.  Newborn Data: Birth date:11/12/2020  Birth time:10:59 AM  Gender:Female  Living status:Living  Apgars:9 ,9  Weight:3861 g   Magnesium Sulfate received: No BMZ received: No Rhophylac:No MMR:No T-DaP:Given prenatally Flu: No Transfusion:No  Physical exam  Vitals:   11/13/20 0215 11/13/20 0600 11/13/20 2145 11/14/20 0534  BP: 106/81 107/77 130/77 109/70  Pulse: 90 97 98 81  Resp: '16 17 17 18  ' Temp: 98.1 F (36.7 C) 98 F (36.7 C) 97.8 F (36.6 C) 97.9 F (36.6 C)  TempSrc: Oral Oral Oral Oral  SpO2: 100% 99%    Weight:      Height:       General: alert, cooperative and no distress Lochia: appropriate Uterine Fundus: firm Incision: Healing well with no significant drainage DVT Evaluation: No evidence of DVT seen on physical exam. Labs: Lab Results  Component Value Date   WBC 14.3 (H) 11/13/2020    HGB 10.7 (L) 11/13/2020   HCT 32.7 (L) 11/13/2020   MCV 89.8 11/13/2020   PLT 134 (L) 11/13/2020   CMP Latest Ref Rng & Units 04/07/2020  Glucose 70 - 99 mg/dL 72  BUN 6 - 20 mg/dL 9  Creatinine 0.44 - 1.00 mg/dL 0.53  Sodium 135 - 145 mmol/L 133(L)  Potassium 3.5 - 5.1 mmol/L 4.7  Chloride 98 - 111 mmol/L 103  CO2 22 - 32 mmol/L 15(L)  Calcium 8.9 - 10.3 mg/dL 9.5  Total Protein 6.5 - 8.1 g/dL 6.7  Total Bilirubin 0.3 - 1.2 mg/dL 1.4(H)  Alkaline Phos 38 - 126 U/L 50  AST 15 - 41 U/L 71(H)  ALT 0 - 44 U/L 100(H)   EdFlavia Shippercore: Edinburgh Postnatal Depression Scale Screening Tool 11/13/2020  I have been able to laugh and see the funny side of things. (No Data)  I have looked forward with enjoyment to things. -  I have blamed myself unnecessarily when things went wrong. -  I have been anxious or worried for no good reason. -  I have felt scared or panicky for no good reason. -  Things have been getting on top of me. -  I have been so unhappy that I have had difficulty sleeping. -  I have felt sad or miserable. -  I have been  so unhappy that I have been crying. -  The thought of harming myself has occurred to me. Flavia Shipper Postnatal Depression Scale Total -      After visit meds:  Allergies as of 11/14/2020   No Known Allergies     Medication List    TAKE these medications   diclofenac Sodium 1 % Gel Commonly known as: Voltaren Apply 2 g topically 4 (four) times daily.   famotidine 20 MG tablet Commonly known as: Pepcid Take 1 tablet (20 mg total) by mouth daily.   levETIRAcetam 500 MG tablet Commonly known as: KEPPRA Take 1 tablet (500 mg total) by mouth 2 (two) times daily.   nitrofurantoin (macrocrystal-monohydrate) 100 MG capsule Commonly known as: Macrobid Take 1 capsule (100 mg total) by mouth 2 (two) times daily.   ondansetron 4 MG tablet Commonly known as: ZOFRAN Take 4 mg by mouth every 8 (eight) hours as needed for nausea or vomiting.         Discharge home in stable condition Infant Feeding: Breast Infant Disposition:home with mother Discharge instruction: per After Visit Summary and Postpartum booklet. Activity: Advance as tolerated. Pelvic rest for 6 weeks.  Diet: routine diet Anticipated Birth Control: Unsure Postpartum Appointment:6 weeks Additional Postpartum F/U: none Future Appointments: Future Appointments  Date Time Provider [place]  11/16/2020 10:10 AM [facility]-SCREENING [facility] None  12/29/2020 10:30 AM Debbora Presto, NP [facility] None   Follow up Visit:      11/14/2020 Tyson Dense, MD

## 2020-11-14 NOTE — Lactation Note (Signed)
This note was copied from a baby's chart. Lactation Consultation Note  Patient Name: Kaitlyn Kim JJKKX'F Date: 11/14/2020 Reason for consult: Follow-up assessment;Term;Nipple pain/trauma;Infant weight loss Age:27 hours  Visited with mom of 46 hours old FT female, she's a P1 and reported moderate breast changes during the pregnancy. Mom and baby are going home today, baby is at 7% weight loss. Reviewed discharge education, lactogenesis II, pumping schedule, normal newborn behavior and feeding cues.  LC also assisted with latch and hand expression, mom able to get plenty of colostrum off her right breast, praised her for her efforts. Noticed that both nipples are scabbed; previous LC noted at short tight lingual frenulum and a high palate, which was observed when baby was crying, but mom voiced pediatrician only addressed high palate.  Mom tried to latch baby on football position first, but baby was frantic after diaper change and did not latch. Then, LC tried some suck training and finger feeding and baby was able to latch in cross cradle position onto the right breast. Baby still nursing when exiting the room; advised mom to break the latch if she becomes shallow (or starts falling asleep).  Feeding plan:  1. Encouraged mom to feed baby 8-12 times/24 hours or sooner if feeding cues are present 2. Pumping after feedings was also recommended to prevent engorgement and for supplementation purposes  GOB (maternal) present and supportive. Family reported all questions and concerns were answered, they're both aware of LC OP services and will call PRN.   Maternal Data    Feeding Mother's Current Feeding Choice: Breast Milk  LATCH Score Latch: Repeated attempts needed to sustain latch, nipple held in mouth throughout feeding, stimulation needed to elicit sucking reflex.  Audible Swallowing: A few with stimulation  Type of Nipple: Everted at rest and after stimulation  Comfort  (Breast/Nipple): Filling, red/small blisters or bruises, mild/mod discomfort  Hold (Positioning): Assistance needed to correctly position infant at breast and maintain latch.  LATCH Score: 6   Lactation Tools Discussed/Used Tools: Pump;Shells Breast pump type: Manual Pump Education: Setup, frequency, and cleaning Reason for Pumping: supplementation Pumping frequency: after feedings at the breast Pumped volume:  (drops)  Interventions Interventions: Breast feeding basics reviewed;Hand express;Breast massage;Assisted with latch;Skin to skin;Breast compression;Adjust position;Support pillows;Position options;Shells;Hand pump;Education  Discharge Discharge Education: Engorgement and breast care;Warning signs for feeding baby;Outpatient recommendation Pump: Manual;Personal (DEBP at home)  Consult Status Consult Status: Complete Date: 11/14/20 Follow-up type: Call as needed    Kaitlyn Kim 11/14/2020, 9:14 AM

## 2020-11-16 ENCOUNTER — Other Ambulatory Visit (HOSPITAL_COMMUNITY): Payer: BC Managed Care – PPO | Attending: Obstetrics & Gynecology

## 2020-11-17 ENCOUNTER — Inpatient Hospital Stay (HOSPITAL_COMMUNITY)
Admission: AD | Admit: 2020-11-17 | Payer: BC Managed Care – PPO | Source: Home / Self Care | Admitting: Obstetrics & Gynecology

## 2020-11-17 ENCOUNTER — Inpatient Hospital Stay (HOSPITAL_COMMUNITY): Payer: BC Managed Care – PPO

## 2020-12-17 DIAGNOSIS — Z1389 Encounter for screening for other disorder: Secondary | ICD-10-CM | POA: Diagnosis not present

## 2020-12-29 ENCOUNTER — Telehealth (INDEPENDENT_AMBULATORY_CARE_PROVIDER_SITE_OTHER): Payer: BC Managed Care – PPO | Admitting: Family Medicine

## 2020-12-29 ENCOUNTER — Encounter: Payer: Self-pay | Admitting: Family Medicine

## 2020-12-29 DIAGNOSIS — G40B09 Juvenile myoclonic epilepsy, not intractable, without status epilepticus: Secondary | ICD-10-CM

## 2020-12-29 DIAGNOSIS — G40309 Generalized idiopathic epilepsy and epileptic syndromes, not intractable, without status epilepticus: Secondary | ICD-10-CM | POA: Diagnosis not present

## 2020-12-29 MED ORDER — LEVETIRACETAM 500 MG PO TABS: 500.0000 mg | ORAL_TABLET | Freq: Two times a day (BID) | ORAL | 4 refills | Status: DC

## 2020-12-29 NOTE — Progress Notes (Addendum)
PATIENT: Kaitlyn Kim DOB: 09/10/1995  REASON FOR VISIT: follow up HISTORY FROM: patient  Virtual Visit via Telephone Note  I connected with Kaitlyn Kim on 12/29/20 at 10:30 AM EDT by telephone and verified that I am speaking with the correct person using two identifiers.   I discussed the limitations, risks, security and privacy concerns of performing an evaluation and management service by telephone and the availability of in person appointments. I also discussed with the patient that there may be a patient responsible charge related to this service. The patient expressed understanding and agreed to proceed.   History of Present Illness:  12/29/20 ALL: Kaitlyn Kim is a 25 y.o. female here today for follow up for seizures. She continues levetiracetam 500mg  BID. She is doing well, today. She recently delivered her first child 11/12/20. She delivered a healthy baby girl via vaginal birth without complications. She and 01/12/21 grace are doing well. She is living with her parents who are very supportive. She is a single mother. She is sleeping well and staying well hydrated.   10/02/19 ALL:  Kaitlyn Kim is a 25 y.o. female here today for follow up for seizures. She continues levetiracetam 500mg  twice daily. She is tolerating medications well with no obvious adverse effects. She continues to work full time. She is feeling well today and without complaints.    HISTORY: (copied from Dr 22 note on 11/08/2017)   Interval history 11/08/2017: Very stable, no issues, no seizures, good compliance, doing great. She works at 11/10/2017.    Interval history 11/08/2016: Patient returns today for follow-up of seizures since age of 39. She has a history of Seizures and Generalized Tonic-Clonic Seizures. She Was Started on Keppra. Juvenile Myoclonic Epilepsy Is in the Differential. I Last Appointment We Continued Her on Keppra, Discussed Lamictal Is a Good Alternative. She is under stress. Doing well on  Keppra no more mood swings. No seizures or staring spells, no jerking movements. We discussed Pradhan staying on Keppra. If she ever has any side effects to the Keppra or changes in mood we can switch to Lamictal. She reports some memory changes which are not uncommon on epilepsy medications we will monitor clinically.   HPI:  Kaitlyn Kim is a 25 y.o. female here as a referral from Dr. Randel Kim for seizures. Patient has a past medical history of late onset absence seizures since the age of 27. She presented to [facility] February, earlier this month, not on any antiepileptics he medications after having episodes of seizures. She started making grunting noises followed by 20-30 seconds of tonic-clonic seizure. He was postictal afterwards. She appeared to bite her tongue and was having trouble breathing. Patient had been having morning seizures for over a month. Seizures started her at 27-19. She had been on Lamictal in the past with side effects. She was switched to Keppra have been off this medication for several years. She did see Dr. March in the past.She was restarted on Keppra. She is here with parents who also provide information. She had a [facility] in 2015, then January 2017 and then [facility] and in December started increasing in frequency. She screams, head back, eyes wide open, goes on for 1-2 minutes. She had 3 in a row and went to the ED. She is irritable on keppra.   Reviewed notes, labs and imaging from outside physicians, which showed:   Reviewed notes from pediatric neurology.Patient was diagnosed with SEIZURES AT THE AGE OF 11. She was trialed on multiple medications  with side effects including Keppra caused aggressiveness, ethosuximide GI upset and panic, lamotrigine was also poorly tolerated.The patient had onset of seizures in 2008.  EEG in January 18, 2008, showed spike and slow wave discharges of 3 Hz and bitemporal slowing. The patient had EEG on June 10, 2011, and on the basis of that,  decision was made to taper and discontinue levetiracetam.   Observations/Objective:  Generalized: Well developed, in no acute distress  Mentation: Alert oriented to time, place, history taking. Follows all commands speech and language fluent   Assessment and Plan:  25 y.o. year old female  has a past medical history of Seizures (HCC). here with    ICD-10-CM   1. Nonintractable juvenile myoclonic epilepsy without status epilepticus (HCC)  G40.B09     2. Generalized nonconvulsive epilepsy (HCC)  G40.309       Kaitlyn Kim is doing well. She will continue levetiracetam 500mg  BID. We have reviewed safety data regarding levetiracetam in breastfeeding mothers. I feel benefits outweigh possible risks. She will continue close follow up with her pediatrician and PCP and will notify me immediately for any concerns. She verbalizes understanding and agreement with this plan.   No orders of the defined types were placed in this encounter.   Meds ordered this encounter  Medications   levETIRAcetam (KEPPRA) 500 MG tablet    Sig: Take 1 tablet (500 mg total) by mouth 2 (two) times daily.    Dispense:  180 tablet    Refill:  4    Order Specific Question:   Supervising Provider    Answer:   Anson Fret      Follow Up Instructions:  I discussed the assessment and treatment plan with the patient. The patient was provided an opportunity to ask questions and all were answered. The patient agreed with the plan and demonstrated an understanding of the instructions.   The patient was advised to call back or seek an in-person evaluation if the symptoms worsen or if the condition fails to improve as anticipated.  I provided 20 minutes of non-face-to-face time during this encounter. Patient located at their place of residence during Mychart visit. Provider is in the office.    [id number], NP    Agree with assessment and plan as stated.     Shawnie Dapper, MD [facility]

## 2020-12-29 NOTE — Patient Instructions (Signed)
Below is our plan:  We will continue levetiracetam 500mg  twice daily. Continue close follow up with your PCP and your daughter's pediatrician. Monitor your daughter closely for any concerns of yellowing skin, weight loss or any other unusual symptoms as discussed.   Please make sure you are staying well hydrated. I recommend 50-60 ounces daily. Well balanced diet and regular exercise encouraged. Consistent sleep schedule with 6-8 hours recommended.   Please continue follow up with care team as directed.   Follow up with me in 1 year   You may receive a survey regarding today's visit. I encourage you to leave honest feed back as I do use this information to improve patient care. Thank you for seeing me today!

## 2021-06-15 ENCOUNTER — Other Ambulatory Visit: Payer: Self-pay

## 2021-06-15 ENCOUNTER — Emergency Department (HOSPITAL_COMMUNITY): Payer: BC Managed Care – PPO

## 2021-06-15 ENCOUNTER — Encounter (HOSPITAL_COMMUNITY): Payer: Self-pay

## 2021-06-15 ENCOUNTER — Emergency Department (HOSPITAL_COMMUNITY)
Admission: EM | Admit: 2021-06-15 | Discharge: 2021-06-15 | Disposition: A | Discharge status: Home / Self Care | Payer: BC Managed Care – PPO | Attending: Emergency Medicine | Admitting: Emergency Medicine

## 2021-06-15 DIAGNOSIS — R112 Nausea with vomiting, unspecified: Secondary | ICD-10-CM | POA: Insufficient documentation

## 2021-06-15 DIAGNOSIS — R0789 Other chest pain: Secondary | ICD-10-CM | POA: Diagnosis not present

## 2021-06-15 DIAGNOSIS — R1013 Epigastric pain: Secondary | ICD-10-CM | POA: Insufficient documentation

## 2021-06-15 DIAGNOSIS — R079 Chest pain, unspecified: Secondary | ICD-10-CM | POA: Diagnosis not present

## 2021-06-15 DIAGNOSIS — I878 Other specified disorders of veins: Secondary | ICD-10-CM | POA: Diagnosis not present

## 2021-06-15 DIAGNOSIS — M542 Cervicalgia: Secondary | ICD-10-CM | POA: Diagnosis not present

## 2021-06-15 DIAGNOSIS — R109 Unspecified abdominal pain: Secondary | ICD-10-CM | POA: Diagnosis not present

## 2021-06-15 DIAGNOSIS — M549 Dorsalgia, unspecified: Secondary | ICD-10-CM | POA: Diagnosis not present

## 2021-06-15 LAB — COMPREHENSIVE METABOLIC PANEL
ALT: 14 U/L (ref 0–44)
AST: 15 U/L (ref 15–41)
Albumin: 4.6 g/dL (ref 3.5–5.0)
Alkaline Phosphatase: 70 U/L (ref 38–126)
Anion gap: 12 (ref 5–15)
BUN: 20 mg/dL (ref 6–20)
CO2: 27 mmol/L (ref 22–32)
Calcium: 9.9 mg/dL (ref 8.9–10.3)
Chloride: 101 mmol/L (ref 98–111)
Creatinine, Ser: 0.75 mg/dL (ref 0.44–1.00)
GFR, Estimated: >60 mL/min (ref >60)
Glucose, Bld: 115 mg/dL — ABNORMAL HIGH (ref 70–99)
Potassium: 3.9 mmol/L (ref 3.5–5.1)
Sodium: 140 mmol/L (ref 135–145)
Total Bilirubin: 0.7 mg/dL (ref 0.3–1.2)
Total Protein: 7.7 g/dL (ref 6.5–8.1)

## 2021-06-15 LAB — URINALYSIS, ROUTINE W REFLEX MICROSCOPIC
Bilirubin Urine: NEGATIVE
Glucose, UA: NEGATIVE mg/dL
Hgb urine dipstick: NEGATIVE
Ketones, ur: 5 mg/dL — AB
Nitrite: NEGATIVE
Protein, ur: NEGATIVE mg/dL
Specific Gravity, Urine: 1.023 (ref 1.005–1.030)
pH: 6 (ref 5.0–8.0)

## 2021-06-15 LAB — CBC WITH DIFFERENTIAL/PLATELET
Abs Immature Granulocytes: 0.02 10*3/uL (ref 0.00–0.07)
Basophils Absolute: 0 10*3/uL (ref 0.0–0.1)
Basophils Relative: 0 %
Eosinophils Absolute: 0.2 10*3/uL (ref 0.0–0.5)
Eosinophils Relative: 2 %
HCT: 41.1 % (ref 36.0–46.0)
Hemoglobin: 13.2 g/dL (ref 12.0–15.0)
Immature Granulocytes: 0 %
Lymphocytes Relative: 37 %
Lymphs Abs: 2.6 10*3/uL (ref 0.7–4.0)
MCH: 29 pg (ref 26.0–34.0)
MCHC: 32.1 g/dL (ref 30.0–36.0)
MCV: 90.3 fL (ref 80.0–100.0)
Monocytes Absolute: 0.5 10*3/uL (ref 0.1–1.0)
Monocytes Relative: 8 %
Neutro Abs: 3.7 10*3/uL (ref 1.7–7.7)
Neutrophils Relative %: 53 %
Platelets: 208 10*3/uL (ref 150–400)
RBC: 4.55 MIL/uL (ref 3.87–5.11)
RDW: 12.9 % (ref 11.5–15.5)
WBC: 7.1 10*3/uL (ref 4.0–10.5)
nRBC: 0 % (ref 0.0–0.2)

## 2021-06-15 LAB — PREGNANCY, URINE: Preg Test, Ur: NEGATIVE

## 2021-06-15 LAB — TROPONIN I (HIGH SENSITIVITY): Troponin I (High Sensitivity): <2 ng/L (ref <18)

## 2021-06-15 LAB — LIPASE, BLOOD: Lipase: 38 U/L (ref 11–51)

## 2021-06-15 MED ORDER — MORPHINE SULFATE (PF) 4 MG/ML IV SOLN: 4.0000 mg | Freq: Once | INTRAVENOUS | Status: DC

## 2021-06-15 MED ORDER — LIDOCAINE VISCOUS HCL 2 % MT SOLN
15.0000 mL | Freq: Once | OROMUCOSAL | Status: AC
  Administered 2021-06-15: 15 mL via ORAL
  Filled 2021-06-15: qty 15

## 2021-06-15 MED ORDER — DICYCLOMINE HCL 10 MG/5ML PO SOLN
10.0000 mg | Freq: Once | ORAL | Status: AC
  Administered 2021-06-15: 10 mg via ORAL
  Filled 2021-06-15: qty 5

## 2021-06-15 MED ORDER — OMEPRAZOLE 20 MG PO CPDR: 20.0000 mg | DELAYED_RELEASE_CAPSULE | Freq: Every day | ORAL | 0 refills | Status: DC

## 2021-06-15 MED ORDER — PANTOPRAZOLE SODIUM 40 MG PO TBEC
40.0000 mg | DELAYED_RELEASE_TABLET | Freq: Every day | ORAL | Status: DC
  Administered 2021-06-15: 40 mg via ORAL
  Filled 2021-06-15: qty 1

## 2021-06-15 MED ORDER — ALUM & MAG HYDROXIDE-SIMETH 200-200-20 MG/5ML PO SUSP
30.0000 mL | Freq: Once | ORAL | Status: AC
  Administered 2021-06-15: 30 mL via ORAL
  Filled 2021-06-15: qty 30

## 2021-06-15 NOTE — ED Triage Notes (Signed)
Pt complains of upper abdominal pain, back pain, and chest pain since 0100. Pt also reports nausea.

## 2021-06-15 NOTE — ED Provider Notes (Signed)
[facility] DEPT Provider Note   CSN: [id number] Arrival date & time: 06/15/21  [id number]     History  Chief Complaint  Patient presents with   Abdominal Pain   Back Pain   Chest Pain    Kaitlyn Kim is a 26 y.o. female.  She has a past medical history of seizure disorder on medical therapy.  She presents the emergency department after awakening from her sleep around 1 AM with upper abdominal pain that extends up into her chest, and into her back.  Since then, the pain has been constant and is gradually worsened.  She describes the pain as being sharp.  She has had some associated nausea and did have 1 episode of emesis in the lobby.  She denies any blood noted.  Never had this pain before.  She is not a regular alcohol user.   Abdominal Pain Associated symptoms: chest pain, nausea and vomiting   Associated symptoms: no chills, no cough, no dysuria, no fever, no hematuria, no shortness of breath and no sore throat   Back Pain Associated symptoms: abdominal pain and chest pain   Associated symptoms: no dysuria and no fever   Chest Pain Associated symptoms: abdominal pain, back pain, nausea and vomiting   Associated symptoms: no cough, no fever, no palpitations and no shortness of breath       Home Medications Prior to Admission medications   Medication Sig Start Date End Date Taking? Authorizing Provider  levETIRAcetam (KEPPRA) 500 MG tablet Take 1 tablet (500 mg total) by mouth 2 (two) times daily. 12/29/20   Lomax, Amy, NP  omeprazole (PRILOSEC) 20 MG capsule Take 1 capsule (20 mg total) by mouth daily for 14 days. 06/15/21 06/29/21  Klea Nall, Adora Fridge, PA-C      Allergies    Patient has no known allergies.    Review of Systems   Review of Systems  Constitutional:  Negative for chills and fever.  HENT:  Negative for ear pain and sore throat.   Eyes:  Negative for pain and visual disturbance.  Respiratory:  Negative for cough and shortness of breath.    Cardiovascular:  Positive for chest pain. Negative for palpitations.  Gastrointestinal:  Positive for abdominal pain, nausea and vomiting. Negative for blood in stool.  Genitourinary:  Negative for dysuria and hematuria.  Musculoskeletal:  Positive for back pain. Negative for arthralgias.  Skin:  Negative for color change and rash.  Neurological:  Negative for seizures and syncope.  All other systems reviewed and are negative.  Physical Exam Updated Vital Signs BP 104/66 (BP Location: Left Arm)    Pulse 79    Temp (!) 97.5 F (36.4 C) (Oral)    Resp 16    LMP 06/08/2021    SpO2 97%  Physical Exam Vitals and nursing note reviewed.  Constitutional:      General: She is in acute distress.     Appearance: Normal appearance. She is not ill-appearing, toxic-appearing or diaphoretic.     Comments: Appears uncomfortable and in pain  HENT:     Head: Normocephalic and atraumatic.     Nose: No nasal deformity.     Mouth/Throat:     Lips: Pink. No lesions.     Mouth: No injury, lacerations, oral lesions or angioedema.     Pharynx: Uvula midline. No uvula swelling.  Eyes:     General: Gaze aligned appropriately. No scleral icterus.       Right eye: No discharge.  Left eye: No discharge.     Conjunctiva/sclera: Conjunctivae normal.     Right eye: Right conjunctiva is not injected. No exudate or hemorrhage.    Left eye: Left conjunctiva is not injected. No exudate or hemorrhage. Cardiovascular:     Rate and Rhythm: Normal rate and regular rhythm.     Pulses: Normal pulses.          Radial pulses are 2+ on the right side and 2+ on the left side.       Dorsalis pedis pulses are 2+ on the right side and 2+ on the left side.     Heart sounds: Normal heart sounds, S1 normal and S2 normal. Heart sounds not distant. No murmur heard.   No friction rub. No gallop. No S3 or S4 sounds.  Pulmonary:     Effort: Pulmonary effort is normal. No accessory muscle usage or respiratory distress.      Breath sounds: Normal breath sounds. No stridor. No wheezing, rhonchi or rales.  Chest:     Chest wall: No tenderness.  Abdominal:     General: Abdomen is flat. There is no distension.     Palpations: Abdomen is soft. There is no mass or pulsatile mass.     Tenderness: There is abdominal tenderness in the epigastric area. There is no guarding or rebound. Negative signs include Murphy's sign and McBurney's sign.     Comments: Significant tenderness of epigastric region. Mild generalized tenderness  Musculoskeletal:     Right lower leg: No edema.     Left lower leg: No edema.  Skin:    General: Skin is warm and dry.     Coloration: Skin is not jaundiced or pale.     Findings: No bruising, erythema, lesion or rash.  Neurological:     General: No focal deficit present.     Mental Status: She is alert and oriented to person, place, and time.     GCS: GCS eye subscore is 4. GCS verbal subscore is 5. GCS motor subscore is 6.  Psychiatric:        Mood and Affect: Mood normal.        Behavior: Behavior normal. Behavior is cooperative.    ED Results / Procedures / Treatments   Labs (all labs ordered are listed, but only abnormal results are displayed) Labs Reviewed  COMPREHENSIVE METABOLIC PANEL - Abnormal; Notable for the following components:      Result Value   Glucose, Bld 115 (*)    All other components within normal limits  URINALYSIS, ROUTINE W REFLEX MICROSCOPIC - Abnormal; Notable for the following components:   APPearance HAZY (*)    Ketones, ur 5 (*)    Leukocytes,Ua SMALL (*)    Bacteria, UA RARE (*)    All other components within normal limits  CBC WITH DIFFERENTIAL/PLATELET  LIPASE, BLOOD  PREGNANCY, URINE  I-STAT BETA HCG BLOOD, ED ([facility], [facility], [facility] ONLY)  TROPONIN I (HIGH SENSITIVITY)    EKG EKG Interpretation  Date/Time:  Tuesday June 15 2021 05:46:14 EST Ventricular Rate:  57 PR Interval:  137 QRS Duration: 84 QT Interval:  418 QTC Calculation: 407 R  Axis:   77 Text Interpretation: Sinus rhythm RSR' in V1 or V2, probably normal variant Confirmed by Veryl Speak [phone]) on 06/15/2021 7:07:08 AM  Radiology DG Abd Acute W/Chest  Result Date: 06/15/2021 CLINICAL DATA:  Abdominal pain, back pain, chest pain EXAM: DG ABDOMEN ACUTE WITH 1 VIEW CHEST COMPARISON:  03/07/2010 FINDINGS: Heart  size and mediastinal contours are within normal limits. Lungs are clear. No effusion. No free air. Normal bowel gas pattern. Right pelvic phlebolith.  No worrisome abdominal calcifications. Regional bones unremarkable. IMPRESSION: No acute cardiopulmonary disease. Negative abdominal radiographs. Electronically Signed   By: Lucrezia Europe M.D.   On: 06/15/2021 11:23    Procedures Procedures    Medications Ordered in ED Medications  pantoprazole (PROTONIX) EC tablet 40 mg (40 mg Oral Given 06/15/21 1134)  alum & mag hydroxide-simeth (MAALOX/MYLANTA) 200-200-20 MG/5ML suspension 30 mL (30 mLs Oral Given 06/15/21 0904)    And  lidocaine (XYLOCAINE) 2 % viscous mouth solution 15 mL (15 mLs Oral Given 06/15/21 0904)  dicyclomine (BENTYL) 10 MG/5ML solution 10 mg (10 mg Oral Given 06/15/21 1135)    ED Course/ Medical Decision Making/ A&P                           Medical Decision Making  This is a 26 y.o. female with a PMH of  seizure disorder on medical therapy who presents to the ED with several hours of worsening and severe epigastric abdominal pain that radiates into chest and back.    Vitals:  stable.  Exam: Abdomen is soft and no peritonitis, however tenderness is moderate in intensity in epigastric region. Appears uncomfortable.  Initial Impression: Concern for PUD, GERD, Pancreatitis, ACS, or other intraabdominal pathology. Plan on initiating abdominal pain workup. Will provide GI cocktail.   I personally reviewed all laboratory work and imaging. Abnormal results outlined below. Largely unremarkable. No leukocytosis, anemia, electrolyte dysfunction. Negative  Lipase. UA unremarkable. Pregnancy negative. Chest/Abd plain films without abnormality.  Events/Interventions: At [phone], patient's abdominal symptoms are significantly improved after GI cocktail.   Impression: Pain improvement after GI cocktail is leading me to believe that this is gastritis or a peptic ulcer.   Dispo Plan: likely f/u with GI. Prescribed two weeks of PPI therapy.  Portions of this note were generated with Lobby[employer]. Dictation errors may occur despite best attempts at proofreading.  Final Clinical Impression(s) / ED Diagnoses Final diagnoses:  Epigastric pain    Rx / DC Orders ED Discharge Orders          Ordered    omeprazole (PRILOSEC) 20 MG capsule  Daily,   Status:  Discontinued        06/15/21 1109    omeprazole (PRILOSEC) 20 MG capsule  Daily        06/15/21 1145              Kaitlyn Kim, Adora Fridge, PA-C 06/15/21 1304    Regan Lemming, MD 06/15/21 1455

## 2021-06-15 NOTE — Discharge Instructions (Addendum)
You have been seen in the ED for abdominal pain. Your labs were normal. You improved after giving medicine that helps for irritation of your stomach and esophagus lining. This is likely a gastritis or peptic ulcer that is causing your symptoms. Please follow up with the Gastroenterology office that is provided for you in your paperwork. Please take Omeprazole once daily for two weeks to help further protect your stomach lining.

## 2021-08-06 ENCOUNTER — Telehealth: Payer: BC Managed Care – PPO | Admitting: Physician Assistant

## 2021-08-06 DIAGNOSIS — B9689 Other specified bacterial agents as the cause of diseases classified elsewhere: Secondary | ICD-10-CM

## 2021-08-06 DIAGNOSIS — J019 Acute sinusitis, unspecified: Secondary | ICD-10-CM

## 2021-08-06 MED ORDER — AMOXICILLIN-POT CLAVULANATE 875-125 MG PO TABS: 1.0000 | ORAL_TABLET | Freq: Two times a day (BID) | ORAL | 0 refills | Status: DC

## 2021-08-06 NOTE — Progress Notes (Signed)
I have spent 5 minutes in review of e-visit questionnaire, review and updating patient chart, medical decision making and response to patient.   Vernis Cabacungan Cody Abdoulaye Drum, PA-C    

## 2021-08-06 NOTE — Progress Notes (Signed)

## 2021-10-11 ENCOUNTER — Other Ambulatory Visit: Payer: Self-pay | Admitting: Physician Assistant

## 2021-10-11 DIAGNOSIS — R1011 Right upper quadrant pain: Secondary | ICD-10-CM | POA: Diagnosis not present

## 2021-10-11 DIAGNOSIS — R112 Nausea with vomiting, unspecified: Secondary | ICD-10-CM

## 2021-10-11 DIAGNOSIS — R1010 Upper abdominal pain, unspecified: Secondary | ICD-10-CM

## 2021-10-13 ENCOUNTER — Ambulatory Visit
Admission: RE | Admit: 2021-10-13 | Discharge: 2021-10-13 | Disposition: A | Discharge status: Home / Self Care | Payer: BC Managed Care – PPO | Source: Ambulatory Visit | Attending: Physician Assistant | Admitting: Physician Assistant

## 2021-10-13 DIAGNOSIS — R1010 Upper abdominal pain, unspecified: Secondary | ICD-10-CM

## 2021-10-13 DIAGNOSIS — R112 Nausea with vomiting, unspecified: Secondary | ICD-10-CM

## 2021-10-13 DIAGNOSIS — R1013 Epigastric pain: Secondary | ICD-10-CM | POA: Diagnosis not present

## 2021-10-13 DIAGNOSIS — R1011 Right upper quadrant pain: Secondary | ICD-10-CM | POA: Diagnosis not present

## 2021-10-15 ENCOUNTER — Other Ambulatory Visit (HOSPITAL_COMMUNITY): Payer: Self-pay | Admitting: Physician Assistant

## 2021-10-15 ENCOUNTER — Other Ambulatory Visit: Payer: Self-pay | Admitting: Physician Assistant

## 2021-10-15 DIAGNOSIS — R112 Nausea with vomiting, unspecified: Secondary | ICD-10-CM

## 2021-10-15 DIAGNOSIS — R1011 Right upper quadrant pain: Secondary | ICD-10-CM

## 2021-10-22 ENCOUNTER — Ambulatory Visit (HOSPITAL_COMMUNITY)
Admission: RE | Admit: 2021-10-22 | Discharge: 2021-10-22 | Disposition: A | Discharge status: Home / Self Care | Payer: BC Managed Care – PPO | Source: Ambulatory Visit | Attending: Physician Assistant | Admitting: Physician Assistant

## 2021-10-22 DIAGNOSIS — R1011 Right upper quadrant pain: Secondary | ICD-10-CM

## 2021-10-22 DIAGNOSIS — R112 Nausea with vomiting, unspecified: Secondary | ICD-10-CM | POA: Diagnosis not present

## 2021-10-22 DIAGNOSIS — K219 Gastro-esophageal reflux disease without esophagitis: Secondary | ICD-10-CM | POA: Diagnosis not present

## 2021-10-22 MED ORDER — MORPHINE BOLUS VIA INFUSION: 3.0000 mg | Freq: Once | INTRAVENOUS | Status: DC

## 2021-10-22 MED ORDER — MORPHINE SULFATE (PF) 2 MG/ML IV SOLN
3.0000 mg | Freq: Once | INTRAVENOUS | Status: AC
  Administered 2021-10-22: 3 mg via INTRAVENOUS

## 2021-10-22 MED ORDER — TECHNETIUM TC 99M MEBROFENIN IV KIT
5.1200 | PACK | Freq: Once | INTRAVENOUS | Status: AC | PRN
  Administered 2021-10-22: 5.12 via INTRAVENOUS

## 2021-10-22 MED ORDER — MORPHINE SULFATE (PF) 4 MG/ML IV SOLN
INTRAVENOUS | Status: AC
  Filled 2021-10-22: qty 1

## 2021-10-24 ENCOUNTER — Emergency Department (HOSPITAL_COMMUNITY): Payer: BC Managed Care – PPO | Admitting: Anesthesiology

## 2021-10-24 ENCOUNTER — Ambulatory Visit (HOSPITAL_COMMUNITY)
Admission: EM | Admit: 2021-10-24 | Discharge: 2021-10-24 | Disposition: A | Discharge status: Home / Self Care | Payer: BC Managed Care – PPO | Attending: Emergency Medicine | Admitting: Emergency Medicine

## 2021-10-24 ENCOUNTER — Other Ambulatory Visit: Payer: Self-pay

## 2021-10-24 ENCOUNTER — Encounter (HOSPITAL_COMMUNITY)
Admission: EM | Disposition: A | Discharge status: Home / Self Care | Payer: Self-pay | Source: Home / Self Care | Attending: Emergency Medicine

## 2021-10-24 ENCOUNTER — Emergency Department (HOSPITAL_COMMUNITY): Payer: BC Managed Care – PPO

## 2021-10-24 ENCOUNTER — Encounter (HOSPITAL_COMMUNITY): Payer: Self-pay | Admitting: Emergency Medicine

## 2021-10-24 DIAGNOSIS — Z20822 Contact with and (suspected) exposure to covid-19: Secondary | ICD-10-CM | POA: Insufficient documentation

## 2021-10-24 DIAGNOSIS — R569 Unspecified convulsions: Secondary | ICD-10-CM | POA: Diagnosis not present

## 2021-10-24 DIAGNOSIS — K8020 Calculus of gallbladder without cholecystitis without obstruction: Secondary | ICD-10-CM | POA: Diagnosis not present

## 2021-10-24 DIAGNOSIS — K8066 Calculus of gallbladder and bile duct with acute and chronic cholecystitis without obstruction: Secondary | ICD-10-CM | POA: Diagnosis not present

## 2021-10-24 DIAGNOSIS — K8012 Calculus of gallbladder with acute and chronic cholecystitis without obstruction: Secondary | ICD-10-CM | POA: Insufficient documentation

## 2021-10-24 DIAGNOSIS — K811 Chronic cholecystitis: Secondary | ICD-10-CM | POA: Diagnosis not present

## 2021-10-24 DIAGNOSIS — K824 Cholesterolosis of gallbladder: Secondary | ICD-10-CM | POA: Diagnosis not present

## 2021-10-24 DIAGNOSIS — R1011 Right upper quadrant pain: Secondary | ICD-10-CM | POA: Diagnosis not present

## 2021-10-24 HISTORY — PX: CHOLECYSTECTOMY: SHX55

## 2021-10-24 LAB — URINALYSIS, ROUTINE W REFLEX MICROSCOPIC
Bilirubin Urine: NEGATIVE
Glucose, UA: NEGATIVE mg/dL
Hgb urine dipstick: NEGATIVE
Ketones, ur: NEGATIVE mg/dL
Nitrite: NEGATIVE
Protein, ur: NEGATIVE mg/dL
Specific Gravity, Urine: 1.018 (ref 1.005–1.030)
pH: 6 (ref 5.0–8.0)

## 2021-10-24 LAB — COMPREHENSIVE METABOLIC PANEL
ALT: 12 U/L (ref 0–44)
AST: 14 U/L — ABNORMAL LOW (ref 15–41)
Albumin: 4.4 g/dL (ref 3.5–5.0)
Alkaline Phosphatase: 57 U/L (ref 38–126)
Anion gap: 8 (ref 5–15)
BUN: 17 mg/dL (ref 6–20)
CO2: 25 mmol/L (ref 22–32)
Calcium: 9.5 mg/dL (ref 8.9–10.3)
Chloride: 105 mmol/L (ref 98–111)
Creatinine, Ser: 0.91 mg/dL (ref 0.44–1.00)
GFR, Estimated: >60 mL/min (ref >60)
Glucose, Bld: 103 mg/dL — ABNORMAL HIGH (ref 70–99)
Potassium: 3.9 mmol/L (ref 3.5–5.1)
Sodium: 138 mmol/L (ref 135–145)
Total Bilirubin: 0.5 mg/dL (ref 0.3–1.2)
Total Protein: 7 g/dL (ref 6.5–8.1)

## 2021-10-24 LAB — RESP PANEL BY RT-PCR (FLU A&B, COVID) ARPGX2
Influenza A by PCR: NEGATIVE
Influenza B by PCR: NEGATIVE
SARS Coronavirus 2 by RT PCR: NEGATIVE

## 2021-10-24 LAB — CBC
HCT: 38.4 % (ref 36.0–46.0)
Hemoglobin: 12.6 g/dL (ref 12.0–15.0)
MCH: 28.8 pg (ref 26.0–34.0)
MCHC: 32.8 g/dL (ref 30.0–36.0)
MCV: 87.9 fL (ref 80.0–100.0)
Platelets: 168 10*3/uL (ref 150–400)
RBC: 4.37 MIL/uL (ref 3.87–5.11)
RDW: 13 % (ref 11.5–15.5)
WBC: 5.8 10*3/uL (ref 4.0–10.5)
nRBC: 0 % (ref 0.0–0.2)

## 2021-10-24 LAB — I-STAT BETA HCG BLOOD, ED (MC, WL, AP ONLY): I-stat hCG, quantitative: <5 m[IU]/mL (ref <5)

## 2021-10-24 LAB — LIPASE, BLOOD: Lipase: 49 U/L (ref 11–51)

## 2021-10-24 SURGERY — LAPAROSCOPIC CHOLECYSTECTOMY WITH INTRAOPERATIVE CHOLANGIOGRAM
Anesthesia: General | Site: Abdomen

## 2021-10-24 MED ORDER — SODIUM CHLORIDE 0.9 % IR SOLN
Status: DC | PRN
  Administered 2021-10-24: 1000 mL

## 2021-10-24 MED ORDER — ONDANSETRON HCL 4 MG/2ML IJ SOLN
4.0000 mg | Freq: Once | INTRAMUSCULAR | Status: AC
  Administered 2021-10-24: 4 mg via INTRAVENOUS
  Filled 2021-10-24: qty 2

## 2021-10-24 MED ORDER — DEXAMETHASONE SODIUM PHOSPHATE 10 MG/ML IJ SOLN
INTRAMUSCULAR | Status: DC | PRN
Start: 2021-10-24 — End: 2021-10-24
  Administered 2021-10-24: 10 mg via INTRAVENOUS

## 2021-10-24 MED ORDER — MIDAZOLAM HCL 2 MG/2ML IJ SOLN
INTRAMUSCULAR | Status: AC
  Filled 2021-10-24: qty 2

## 2021-10-24 MED ORDER — OXYCODONE HCL 5 MG PO TABS: 5.0000 mg | ORAL_TABLET | Freq: Once | ORAL | Status: DC | PRN

## 2021-10-24 MED ORDER — FENTANYL CITRATE (PF) 250 MCG/5ML IJ SOLN
INTRAMUSCULAR | Status: AC
  Filled 2021-10-24: qty 5

## 2021-10-24 MED ORDER — MIDAZOLAM HCL 2 MG/2ML IJ SOLN
INTRAMUSCULAR | Status: DC | PRN
  Administered 2021-10-24: 2 mg via INTRAVENOUS

## 2021-10-24 MED ORDER — ONDANSETRON HCL 4 MG/2ML IJ SOLN
INTRAMUSCULAR | Status: DC | PRN
  Administered 2021-10-24: 4 mg via INTRAVENOUS

## 2021-10-24 MED ORDER — LACTATED RINGERS IV SOLN: INTRAVENOUS | Status: DC

## 2021-10-24 MED ORDER — FENTANYL CITRATE (PF) 100 MCG/2ML IJ SOLN: 25.0000 ug | INTRAMUSCULAR | Status: DC | PRN

## 2021-10-24 MED ORDER — SODIUM CHLORIDE 0.9 % IV SOLN
2.0000 g | INTRAVENOUS | Status: DC
  Administered 2021-10-24: 2 g via INTRAVENOUS
  Filled 2021-10-24: qty 20

## 2021-10-24 MED ORDER — ACETAMINOPHEN 10 MG/ML IV SOLN: 1000.0000 mg | Freq: Once | INTRAVENOUS | Status: DC | PRN

## 2021-10-24 MED ORDER — SUGAMMADEX SODIUM 200 MG/2ML IV SOLN
INTRAVENOUS | Status: DC | PRN
  Administered 2021-10-24: 200 mg via INTRAVENOUS

## 2021-10-24 MED ORDER — LACTATED RINGERS IV BOLUS
1000.0000 mL | Freq: Once | INTRAVENOUS | Status: AC
  Administered 2021-10-24: 1000 mL via INTRAVENOUS

## 2021-10-24 MED ORDER — BUPIVACAINE-EPINEPHRINE (PF) 0.25% -1:200000 IJ SOLN
INTRAMUSCULAR | Status: AC
  Filled 2021-10-24: qty 30

## 2021-10-24 MED ORDER — ROCURONIUM BROMIDE 10 MG/ML (PF) SYRINGE
PREFILLED_SYRINGE | INTRAVENOUS | Status: DC | PRN
  Administered 2021-10-24: 50 mg via INTRAVENOUS

## 2021-10-24 MED ORDER — DOCUSATE SODIUM 100 MG PO CAPS: 100.0000 mg | ORAL_CAPSULE | Freq: Two times a day (BID) | ORAL | 0 refills | Status: AC

## 2021-10-24 MED ORDER — CHLORHEXIDINE GLUCONATE 0.12 % MT SOLN
OROMUCOSAL | Status: AC
  Administered 2021-10-24: 15 mL via OROMUCOSAL
  Filled 2021-10-24: qty 15

## 2021-10-24 MED ORDER — AMISULPRIDE (ANTIEMETIC) 5 MG/2ML IV SOLN: 10.0000 mg | Freq: Once | INTRAVENOUS | Status: DC | PRN

## 2021-10-24 MED ORDER — METRONIDAZOLE 500 MG/100ML IV SOLN
500.0000 mg | Freq: Three times a day (TID) | INTRAVENOUS | Status: DC
  Administered 2021-10-24: 500 mg via INTRAVENOUS
  Filled 2021-10-24: qty 100

## 2021-10-24 MED ORDER — FENTANYL CITRATE PF 50 MCG/ML IJ SOSY
50.0000 ug | PREFILLED_SYRINGE | Freq: Once | INTRAMUSCULAR | Status: AC
  Administered 2021-10-24: 50 ug via INTRAVENOUS
  Filled 2021-10-24: qty 1

## 2021-10-24 MED ORDER — BUPIVACAINE-EPINEPHRINE 0.25% -1:200000 IJ SOLN
INTRAMUSCULAR | Status: DC | PRN
  Administered 2021-10-24: 23 mL

## 2021-10-24 MED ORDER — OXYCODONE HCL 5 MG/5ML PO SOLN: 5.0000 mg | Freq: Once | ORAL | Status: DC | PRN

## 2021-10-24 MED ORDER — PROPOFOL 10 MG/ML IV BOLUS
INTRAVENOUS | Status: AC
  Filled 2021-10-24: qty 20

## 2021-10-24 MED ORDER — PHENYLEPHRINE 80 MCG/ML (10ML) SYRINGE FOR IV PUSH (FOR BLOOD PRESSURE SUPPORT)
PREFILLED_SYRINGE | INTRAVENOUS | Status: DC | PRN
  Administered 2021-10-24: 80 ug via INTRAVENOUS

## 2021-10-24 MED ORDER — CHLORHEXIDINE GLUCONATE 0.12 % MT SOLN: 15.0000 mL | Freq: Once | OROMUCOSAL | Status: AC

## 2021-10-24 MED ORDER — FENTANYL CITRATE (PF) 250 MCG/5ML IJ SOLN
INTRAMUSCULAR | Status: DC | PRN
  Administered 2021-10-24: 150 ug via INTRAVENOUS
  Administered 2021-10-24: 50 ug via INTRAVENOUS

## 2021-10-24 MED ORDER — ORAL CARE MOUTH RINSE: 15.0000 mL | Freq: Once | OROMUCOSAL | Status: AC

## 2021-10-24 MED ORDER — KETOROLAC TROMETHAMINE 30 MG/ML IJ SOLN: 30.0000 mg | Freq: Once | INTRAMUSCULAR | Status: DC

## 2021-10-24 MED ORDER — LIDOCAINE 2% (20 MG/ML) 5 ML SYRINGE
INTRAMUSCULAR | Status: DC | PRN
  Administered 2021-10-24: 60 mg via INTRAVENOUS

## 2021-10-24 MED ORDER — TRAMADOL HCL 50 MG PO TABS: 50.0000 mg | ORAL_TABLET | Freq: Four times a day (QID) | ORAL | 0 refills | Status: AC | PRN

## 2021-10-24 MED ORDER — 0.9 % SODIUM CHLORIDE (POUR BTL) OPTIME
TOPICAL | Status: DC | PRN
  Administered 2021-10-24: 1000 mL

## 2021-10-24 MED ORDER — SCOPOLAMINE 1 MG/3DAYS TD PT72
1.0000 | MEDICATED_PATCH | TRANSDERMAL | Status: DC
  Administered 2021-10-24: 1.5 mg via TRANSDERMAL
  Filled 2021-10-24: qty 1

## 2021-10-24 MED ORDER — PROPOFOL 10 MG/ML IV BOLUS
INTRAVENOUS | Status: DC | PRN
  Administered 2021-10-24: 150 mg via INTRAVENOUS

## 2021-10-24 SURGICAL SUPPLY — 33 items
APPLIER CLIP 5 13 M/L LIGAMAX5 (MISCELLANEOUS) ×2
CANISTER SUCT 3000ML PPV (MISCELLANEOUS) ×2 IMPLANT
CHLORAPREP W/TINT 26 (MISCELLANEOUS) ×2 IMPLANT
CLIP APPLIE 5 13 M/L LIGAMAX5 (MISCELLANEOUS) ×1 IMPLANT
CNTNR URN SCR LID CUP LEK RST (MISCELLANEOUS) ×1 IMPLANT
CONT SPEC 4OZ STRL OR WHT (MISCELLANEOUS) ×2
COVER SURGICAL LIGHT HANDLE (MISCELLANEOUS) ×2 IMPLANT
DERMABOND ADVANCED (GAUZE/BANDAGES/DRESSINGS) ×1
DERMABOND ADVANCED .7 DNX12 (GAUZE/BANDAGES/DRESSINGS) ×1 IMPLANT
ELECT REM PT RETURN 9FT ADLT (ELECTROSURGICAL) ×2
ELECTRODE REM PT RTRN 9FT ADLT (ELECTROSURGICAL) ×1 IMPLANT
GLOVE BIO SURGEON STRL SZ 6 (GLOVE) ×2 IMPLANT
GLOVE INDICATOR 6.5 STRL GRN (GLOVE) ×2 IMPLANT
GOWN STRL REUS W/ TWL LRG LVL3 (GOWN DISPOSABLE) ×3 IMPLANT
GOWN STRL REUS W/TWL LRG LVL3 (GOWN DISPOSABLE) ×6
GRASPER SUT TROCAR 14GX15 (MISCELLANEOUS) ×2 IMPLANT
KIT BASIN OR (CUSTOM PROCEDURE TRAY) ×2 IMPLANT
KIT TURNOVER KIT B (KITS) ×2 IMPLANT
NDL INSUFFLATION 14GA 120MM (NEEDLE) ×1 IMPLANT
NEEDLE INSUFFLATION 14GA 120MM (NEEDLE) ×2 IMPLANT
NS IRRIG 1000ML POUR BTL (IV SOLUTION) ×2 IMPLANT
PAD ARMBOARD 7.5X6 YLW CONV (MISCELLANEOUS) ×2 IMPLANT
POUCH SPECIMEN RETRIEVAL 10MM (ENDOMECHANICALS) ×2 IMPLANT
SCISSORS LAP 5X35 DISP (ENDOMECHANICALS) ×2 IMPLANT
SET IRRIG TUBING LAPAROSCOPIC (IRRIGATION / IRRIGATOR) ×2 IMPLANT
SET TUBE SMOKE EVAC HIGH FLOW (TUBING) ×2 IMPLANT
SLEEVE ENDOPATH XCEL 5M (ENDOMECHANICALS) ×4 IMPLANT
SUT MNCRL AB 4-0 PS2 18 (SUTURE) ×2 IMPLANT
TOWEL GREEN STERILE (TOWEL DISPOSABLE) ×2 IMPLANT
TRAY LAPAROSCOPIC MC (CUSTOM PROCEDURE TRAY) ×2 IMPLANT
TROCAR XCEL NON-BLD 11X100MML (ENDOMECHANICALS) ×2 IMPLANT
TROCAR XCEL NON-BLD 5MMX100MML (ENDOMECHANICALS) ×2 IMPLANT
WATER STERILE IRR 1000ML POUR (IV SOLUTION) ×2 IMPLANT

## 2021-10-24 NOTE — Discharge Instructions (Addendum)
[facility]  Please arrive at least 30 min before your appointment to complete your check in paperwork.  If you are unable to arrive 30 min prior to your appointment time we may have to cancel or reschedule you. LAPAROSCOPIC SURGERY: POST OP INSTRUCTIONS Always review your discharge instruction sheet given to you by the facility where your surgery was performed. IF YOU HAVE DISABILITY OR FAMILY LEAVE FORMS, YOU MUST BRING THEM TO THE OFFICE FOR PROCESSING.   DO NOT GIVE THEM TO YOUR DOCTOR.  PAIN CONTROL  First take acetaminophen (Tylenol) AND/or ibuprofen (Advil) to control your pain after surgery.  Follow directions on package.  Taking acetaminophen (Tylenol) and/or ibuprofen (Advil) regularly after surgery will help to control your pain and lower the amount of prescription pain medication you may need.  You should not take more than 4,000 mg (4 grams) of acetaminophen (Tylenol) in 24 hours.  You should not take ibuprofen (Advil), aleve, motrin, naprosyn or other NSAIDS if you have a history of stomach ulcers or chronic kidney disease.  A prescription for pain medication may be given to you upon discharge.  Take your pain medication as prescribed, if you still have uncontrolled pain after taking acetaminophen (Tylenol) or ibuprofen (Advil). Use ice packs to help control pain. If you need a refill on your pain medication, please contact your pharmacy.  They will contact our office to request authorization. Prescriptions will not be filled after 5pm or on week-ends.  HOME MEDICATIONS Take your usually prescribed medications unless otherwise directed.  DIET You should follow a light diet the first few days after arrival home.  Be sure to include lots of fluids daily. Avoid fatty, fried foods.   CONSTIPATION It is common to experience some constipation after surgery and if you are taking pain medication.  Increasing fluid intake and taking a stool softener (such as Colace)  will usually help or prevent this problem from occurring.  A mild laxative (Milk of Magnesia or Miralax) should be taken according to package instructions if there are no bowel movements after 48 hours.  WOUND/INCISION CARE Most patients will experience some swelling and bruising in the area of the incisions.  Ice packs will help.  Swelling and bruising can take several days to resolve.  Unless discharge instructions indicate otherwise, follow guidelines below  STERI-STRIPS - you may remove your outer bandages 48 hours after surgery, and you may shower at that time.  You have steri-strips (small skin tapes) in place directly over the incision.  These strips should be left on the skin for 7-10 days.   DERMABOND/SKIN GLUE - you may shower in 24 hours.  The glue will flake off over the next 2-3 weeks. Any sutures or staples will be removed at the office during your follow-up visit.  ACTIVITIES You may resume regular (light) daily activities beginning the next day--such as daily self-care, walking, climbing stairs--gradually increasing activities as tolerated.  You may have sexual intercourse when it is comfortable.  Refrain from any heavy lifting or straining until approved by your doctor. You may drive when you are no longer taking prescription pain medication, you can comfortably wear a seatbelt, and you can safely maneuver your car and apply brakes.  FOLLOW-UP You should see your doctor in the office for a follow-up appointment approximately 2-3 weeks after your surgery.  You should have been given your post-op/follow-up appointment when your surgery was scheduled.  If you did not receive a post-op/follow-up appointment, make sure   that you call for this appointment within a day or two after you arrive home to insure a convenient appointment time.   WHEN TO CALL YOUR DOCTOR: Fever over 101.0 Inability to urinate Continued bleeding from incision. Increased pain, redness, or drainage from the  incision. Increasing abdominal pain  The clinic staff is available to answer your questions during regular business hours.  Please don't hesitate to call and ask to speak to one of the nurses for clinical concerns.  If you have a medical emergency, go to the nearest emergency room or call 911.  A surgeon from [facility] is always on call at the hospital. [address], [address], [place], [place]  [address] ? [address] [address], [place],    [address] [phone] ? [phone] ? FAX [phone]  

## 2021-10-24 NOTE — Anesthesia Procedure Notes (Signed)
Procedure Name: Intubation ?Date/Time: 10/24/2021 1:43 PM ?Performed by: Griffin Dakin, CRNA ?Pre-anesthesia Checklist: Patient identified, Emergency Drugs available, Suction available and Patient being monitored ?Patient Re-evaluated:Patient Re-evaluated prior to induction ?Oxygen Delivery Method: Circle system utilized ?Preoxygenation: Pre-oxygenation with 100% oxygen ?Induction Type: IV induction ?Ventilation: Mask ventilation without difficulty ?Laryngoscope Size: Mac and 3 ?Grade View: Grade I ?Tube type: Oral ?Tube size: 7.0 mm ?Number of attempts: 1 ?Airway Equipment and Method: Stylet and Oral airway ?Placement Confirmation: ETT inserted through vocal cords under direct vision, positive ETCO2 and breath sounds checked- equal and bilateral ?Secured at: 22 cm ?Tube secured with: Tape ?Dental Injury: Teeth and Oropharynx as per pre-operative assessment  ? ? ? ? ?

## 2021-10-24 NOTE — Transfer of Care (Signed)
Immediate Anesthesia Transfer of Care Note ? ?Patient: Kaitlyn Kim ? ?Procedure(s) Performed: LAPAROSCOPIC CHOLECYSTECTOMY (Abdomen) ? ?Patient Location: PACU ? ?Anesthesia Type:General ? ?Level of Consciousness: awake, alert  and oriented ? ?Airway & Oxygen Therapy: Patient Spontanous Breathing ? ?Post-op Assessment: Report given to RN and Post -op Vital signs reviewed and stable ? ?Post vital signs: Reviewed and stable ? ?Last Vitals:  ?Vitals Value Taken Time  ?BP 107/68 10/24/21 1455  ?Temp    ?Pulse 86 10/24/21 1456  ?Resp 13 10/24/21 1456  ?SpO2 100 % 10/24/21 1456  ?Vitals shown include unvalidated device data. ? ?Last Pain:  ?Vitals:  ? 10/24/21 1306  ?TempSrc: Oral  ?PainSc: 0-No pain  ?   ? ?  ? ?Complications: No notable events documented. ?

## 2021-10-24 NOTE — ED Provider Notes (Signed)
?[facility] ?Provider Note ? ? ?CSN: [id number] ?Arrival date & time: 10/24/21  0341 ? ?  ? ?History ? ?Chief Complaint  ?Patient presents with  ? Abdominal Pain  ? ? ?Kaitlyn Pigglicia Clare is a 26 y.o. female. ? ? ?Abdominal Pain ? ?26 year old female with history of seizures who presents to the emergency department with acute on chronic abdominal pain.  The patient states that she has had epigastric and right upper quadrant abdominal pain intermittently since January.  She was initially seen in the emergency department on 06/15/2021 with epigastric abdominal pain that radiated to her back.  She had negative abdominal x-ray radiography at that time, reassuring labs and was discharged with plan to follow-up outpatient with gastroenterology.  [facility].  She had an outpatient right upper quadrant ultrasound which did not reveal the gallbladder.  She had a subsequent HIDA scan outpatient which was equivocal for cholecystitis as the gallbladder was not seen.  She was advised to present to the emergency department in the event of worsening symptoms.  She states that she has had a "flareup" of her symptoms over the past few days with right upper quadrant abdominal pain, radiating to her back and right shoulder.  She endorses nausea, no vomiting.  She denies any fevers or chills. ? ?Home Medications ?Prior to Admission medications   ?Medication Sig Start Date End Date Taking? Authorizing Provider  ?docusate sodium (COLACE) 100 MG capsule Take 1 capsule (100 mg total) by mouth 2 (two) times daily. Okay to decrease to once daily or stop taking if having loose bowel movements 10/24/21 11/23/21 Yes Berna Bueonnor, Chelsea A, MD  ?levETIRAcetam (KEPPRA) 500 MG tablet Take 1 tablet (500 mg total) by mouth 2 (two) times daily. 12/29/20  Yes Lomax, Amy, NP  ?pantoprazole (PROTONIX) 40 MG tablet Take 40 mg by mouth every morning. 10/11/21  Yes [provider]  ?traMADol  (ULTRAM) 50 MG tablet Take 1 tablet (50 mg total) by mouth every 6 (six) hours as needed for up to 3 days (Pain not relieved by Tylenol, ibuprofen, rest or ice). 10/24/21 10/27/21 Yes Berna Bueonnor, Chelsea A, MD  ?   ? ?Allergies    ?Patient has no known allergies.   ? ?Review of Systems   ?Review of Systems  ?Gastrointestinal:  Positive for abdominal pain.  ?All other systems reviewed and are negative. ? ?Physical Exam ?Updated Vital Signs ?BP 103/71   Pulse 63   Temp 98 ?F (36.7 ?C)   Resp 12   LMP 10/07/2021 (Exact Date)   SpO2 99%   Breastfeeding No  ?Physical Exam ?Vitals and nursing note reviewed.  ?Constitutional:   ?   General: She is not in acute distress. ?   Appearance: She is well-developed.  ?HENT:  ?   Head: Normocephalic and atraumatic.  ?Eyes:  ?   Conjunctiva/sclera: Conjunctivae normal.  ?Cardiovascular:  ?   Rate and Rhythm: Normal rate and regular rhythm.  ?Pulmonary:  ?   Effort: Pulmonary effort is normal. No respiratory distress.  ?   Breath sounds: Normal breath sounds.  ?Abdominal:  ?   Palpations: Abdomen is soft.  ?   Tenderness: There is abdominal tenderness in the right upper quadrant. There is no guarding or rebound. Negative signs include Murphy's sign.  ?Musculoskeletal:     ?   General: No swelling.  ?   Cervical back: Neck supple.  ?Skin: ?   General: Skin is warm and dry.  ?  Capillary Refill: Capillary refill takes less than 2 seconds.  ?Neurological:  ?   Mental Status: She is alert.  ?Psychiatric:     ?   Mood and Affect: Mood normal.  ? ? ?ED Results / Procedures / Treatments   ?Labs ?(all labs ordered are listed, but only abnormal results are displayed) ?Labs Reviewed  ?COMPREHENSIVE METABOLIC PANEL - Abnormal; Notable for the following components:  ?    Result Value  ? Glucose, Bld 103 (*)   ? AST 14 (*)   ? All other components within normal limits  ?URINALYSIS, ROUTINE W REFLEX MICROSCOPIC - Abnormal; Notable for the following components:  ? APPearance CLOUDY (*)   ?  Leukocytes,Ua TRACE (*)   ? Bacteria, UA RARE (*)   ? All other components within normal limits  ?RESP PANEL BY RT-PCR (FLU A&B, COVID) ARPGX2  ?LIPASE, BLOOD  ?CBC  ?I-STAT BETA HCG BLOOD, ED ([facility], [facility], [facility] ONLY)  ?SURGICAL PATHOLOGY  ? ? ?EKG ?None ? ?Radiology ?US Abdomen Limited RUQ (LIVER/GB) ? ?Result Date: 10/24/2021 ?CLINICAL DATA:  Right upper quadrant pain EXAM: ULTRASOUND ABDOMEN LIMITED RIGHT UPPER QUADRANT COMPARISON:  10/13/2021 FINDINGS: Gallbladder: Multiple small mobile shadowing stones are identified within the gallbladder measuring up to 5 mm. Cholesterol polyp is identified within the wall of the gallbladder measuring 3 mm. Gallbladder wall thickening is identified which measures up to 4 mm in thickness. No pericholecystic fluid or sonographic Murphy's sign. Common bile duct: Diameter: 4 mm. Liver: No focal lesion identified. Within normal limits in parenchymal echogenicity. Portal vein is patent on color Doppler imaging with normal direction of blood flow towards the liver. Other: None. IMPRESSION: Gallstones and gallbladder wall thickening. Cannot rule out cholecystitis. Electronically Signed   By: Signa Kell M.D.   On: 10/24/2021 09:35   ? ?Procedures ?Procedures  ? ? ?Medications Ordered in ED ?Medications  ?cefTRIAXone (ROCEPHIN) 2 g in sodium chloride 0.9 % 100 mL IVPB ( Intravenous Automatically Held 11/01/21 1115)  ?metroNIDAZOLE (FLAGYL) IVPB 500 mg ( Intravenous Automatically Held 11/01/21 1915)  ?lactated ringers infusion ( Intravenous Continued from Pre-op 10/24/21 1333)  ?scopolamine (TRANSDERM-SCOP) 1 MG/3DAYS 1.5 mg (1.5 mg Transdermal Patch Applied 10/24/21 1314)  ?oxyCODONE (Oxy IR/ROXICODONE) immediate release tablet 5 mg (has no administration in time range)  ?  Or  ?oxyCODONE (ROXICODONE) 5 MG/5ML solution 5 mg (has no administration in time range)  ?fentaNYL (SUBLIMAZE) injection 25-50 mcg (has no administration in time range)  ?amisulpride (BARHEMSYS) injection 10 mg (has no  administration in time range)  ?acetaminophen (OFIRMEV) IV 1,000 mg (has no administration in time range)  ?ketorolac (TORADOL) 30 MG/ML injection 30 mg (has no administration in time range)  ?lactated ringers bolus 1,000 mL (0 mLs Intravenous Stopped 10/24/21 1122)  ?fentaNYL (SUBLIMAZE) injection 50 mcg (50 mcg Intravenous Given 10/24/21 0802)  ?ondansetron (ZOFRAN) injection 4 mg (4 mg Intravenous Given 10/24/21 0800)  ?chlorhexidine (PERIDEX) 0.12 % solution 15 mL (15 mLs Mouth/Throat Given 10/24/21 1309)  ?  Or  ?MEDLINE mouth rinse ( Mouth Rinse See Alternative 10/24/21 1309)  ? ? ?ED Course/ Medical Decision Making/ A&P ?  ?                        ?Medical Decision Making ?Amount and/or Complexity of Data Reviewed ?Labs: ordered. ?Radiology: ordered. ? ?Risk ?Prescription drug management. ? ? ?26 year old female with history of seizures who presents to the emergency department with acute on chronic abdominal pain.  The patient states that she has had epigastric and right upper quadrant abdominal pain intermittently since January.  She was initially seen in the emergency department on 06/15/2021 with epigastric abdominal pain that radiated to her back.  She had negative abdominal x-ray radiography at that time, reassuring labs and was discharged with plan to follow-up outpatient with gastroenterology.  She saw [facility] gastroenterology in clinic.  She had an outpatient right upper quadrant ultrasound which did not reveal the gallbladder.  She had a subsequent HIDA scan outpatient which was equivocal for cholecystitis as the gallbladder was not seen.  She was advised to present to the emergency department in the event of worsening symptoms.  She states that she has had a "flareup" of her symptoms over the past few days with right upper quadrant abdominal pain, radiating to her back and right shoulder.  She endorses nausea, no vomiting.  She denies any fevers or chills. ? ?On arrival, the patient was afebrile,  hemodynamically stable, sinus rhythm noted on cardiac telemetry.  Physical exam significant for right upper quadrant tenderness to palpation, no rebound or guarding, negative Murphy sign. ? ?The patient presents after a HIDA scan that

## 2021-10-24 NOTE — ED Notes (Signed)
Paper consent was obtained from pt for procedure & witnessed by this RN & is at bedside. ?

## 2021-10-24 NOTE — ED Triage Notes (Signed)
Pt reports having a HIDA scan on Friday and diagnosed with cholecystitis, states she has been having continued pain and nausea. Denied vomiting.  ?

## 2021-10-24 NOTE — Anesthesia Postprocedure Evaluation (Signed)
Anesthesia Post Note ? ?Patient: Kaitlyn Kim ? ?Procedure(s) Performed: LAPAROSCOPIC CHOLECYSTECTOMY (Abdomen) ? ?  ? ?Patient location during evaluation: PACU ?Anesthesia Type: General ?Level of consciousness: awake ?Pain management: pain level controlled ?Vital Signs Assessment: post-procedure vital signs reviewed and stable ?Respiratory status: spontaneous breathing, nonlabored ventilation, respiratory function stable and patient connected to nasal cannula oxygen ?Cardiovascular status: blood pressure returned to baseline and stable ?Postop Assessment: no apparent nausea or vomiting ?Anesthetic complications: no ? ? ?No notable events documented. ? ?Last Vitals:  ?Vitals:  ? 10/24/21 1510 10/24/21 1525  ?BP: 111/74 103/71  ?Pulse: 66 63  ?Resp: (!) 26 12  ?Temp:  36.7 ?C  ?SpO2: 97% 99%  ?  ?Last Pain:  ?Vitals:  ? 10/24/21 1525  ?TempSrc:   ?PainSc: 0-No pain  ? ? ?  ?  ?  ?  ?  ?  ? ?Robina Hamor P Bobbette Eakes ? ? ? ? ?

## 2021-10-24 NOTE — Op Note (Signed)
Operative Note ? ?Kaitlyn Kim 26 y.o. female ?[id number]  ?10/24/2021 ? ?Surgeon: Berna Bue MD FACS ? ?Procedure performed: Laparoscopic Cholecystectomy ? ?Procedure classification: urgent ? ?Preop diagnosis: Acute on chronic cholecystitis ?Post-op diagnosis/intraop findings: same ? ?Specimens: gallbladder ? ?Retained items: none ? ?EBL: minimal ? ?Complications: none ? ?Description of procedure: After obtaining informed consent the patient was brought to the operating room. Antibiotics were administered. SCD's were applied. General endotracheal anesthesia was initiated and a formal time-out was performed. The abdomen was prepped and draped in the usual sterile fashion and the abdomen was entered using an infraumbilical veress needle after instilling the site with local. Insufflation to was obtained, 25mm trocar and camera inserted, and gross inspection revealed no evidence of injury from our entry or other intraabdominal abnormalities. Two 65mm trocars were introduced in the right midclavicular and right anterior axillary lines under direct visualization and following infiltration with local. An 46mm trocar was placed in the epigastrium. The gallbladder was retracted cephalad and the infundibulum was retracted laterally.  The gallbladder is very elongated, pale and thickened with an obvious stone impacted at the cystic duct.  The porta and common bile duct were also easily visible, and protected throughout the dissection.  A combination of hook electrocautery and blunt dissection was utilized to clear the peritoneum from the neck and cystic duct, circumferentially isolating the cystic artery and cystic duct and lifting the gallbladder from the cystic plate. The critical view of safety was achieved with the cystic artery, cystic duct, and liver bed visualized between them with no other structures. The artery was clipped with a single clip proximally and distally and divided as was the cystic duct with  three clips on the proximal end, maintaining the impacted stone on the specimen side..  An additional small posterior branch of the cystic artery was divided with cautery.  The gallbladder was dissected from the liver plate using electrocautery. Once freed the gallbladder was placed in an endocatch bag and removed intact through the epigastric trocar site. Hemostasis was once again confirmed, and reinspection of the abdomen revealed no injuries.  The right upper quadrant was irrigated minimally and the effluent was clear.  The clips were well opposed without any bile leak from the duct or the liver bed. The 67mm trocar site in the epigastrium was closed with a 0 vicryl in the fascia under direct visualization using a PMI device. The abdomen was desufflated and all trocars removed. The skin incisions were closed with subcuticular 4-0 monocryl and Dermabond. The patient was awakened, extubated and transported to the recovery room in stable condition.  ?  ?All counts were correct at the completion of the case. ? ? ? ? ?

## 2021-10-24 NOTE — H&P (Addendum)
? ? ? ?Kaitlyn Kim ?[birth year]  ?[id number].   ? ?Requesting MD: Dr. Karene Fry ?Chief Complaint/Reason for Consult: RUQ pain ? ?HPI: Kaitlyn Kim is a 26 y.o. female who presented to the ED with abdominal pain. Patient reports she has been having intermittent episodes of epigastric/ruq abdominal pain radiating to her back since Jan. This usually occurs at night and lasts for ~6 hours. She saw [facility] as output for this who got an [place] on 5/3 that did not definitively visualize the GB. On Wednesday she had another episode of RUQ/epigastric pain with associated n/v that has persisted to presentation today. Actually had an outpatient HIDA done yesterday that showed nonvisualized gallbladder concerning for an acute cholecystitis. Here her RUQ US showed stones and some GB wall thickening. WBC, Lipase and LFT's wnl. She is not on blood thinners. No prior abdominal surgeries. She has an 60 month old at home. She is not breast feeding. No alcohol, tobacco or illicit drug use. She works in Community education officer.  ? ?ROS: ?Review of Systems  ?Constitutional:  Negative for chills and fever.  ?Respiratory:  Negative for cough and shortness of breath.   ?Cardiovascular:  Negative for chest pain.  ?Gastrointestinal:  Positive for abdominal pain, nausea and vomiting. Negative for constipation and diarrhea.  ?Genitourinary: Negative.  Negative for dysuria.  ?     No vaginal discharge or bleeding  ?Musculoskeletal:  Positive for back pain.  ?Psychiatric/Behavioral:  Negative for substance abuse.   ? ? ?Family History  ?Problem Relation Age of Onset  ? Hyperlipidemia Mother   ? Thyroid disease Mother   ? Hypertension Maternal Grandmother   ? ALS Maternal Grandmother   ? Cancer Paternal Grandmother   ?     lung cancer  ? COPD Paternal Grandmother   ? COPD Paternal Grandfather   ? Seizures Neg Hx   ? ? ?Past Medical History:  ?Diagnosis Date  ? Seizures (HCC)   ? HX of seizures 3 years ago  ? ? ?Past Surgical History:  ?Procedure Laterality Date  ?  LACERATION REPAIR  1998  ? forehead  ? NO PAST SURGERIES    ? ? ?Social History:  reports that she has never smoked. She has never used smokeless tobacco. She reports that she does not currently use alcohol. She reports that she does not use drugs. ? ?Allergies: No Known Allergies ? ?(Not in a hospital admission) ? ? ? ?Physical Exam: ?Blood pressure 103/70, pulse 69, temperature 97.9 ?F (36.6 ?C), temperature source Oral, resp. rate 15, last menstrual period 10/07/2021, SpO2 98 %, not currently breastfeeding. ?General: pleasant, WD/WN white female who is laying in bed in NAD ?HEENT: head is normocephalic, atraumatic.  Sclera are noninjected.  PERRL.  Ears and nose without any masses or lesions.  Mouth is pink and moist. Dentition fair ?Heart: regular, rate, and rhythm.   Palpable pedal pulses bilaterally  ?Lungs: CTAB, no wheezes, rhonchi, or rales noted.  Respiratory effort nonlabored ?Abd:  Soft, ND, mild epigastric ttp without peritonitis, +BS, no masses, hernias, or organomegaly ?MS: no BUE/BLE edema, calves soft and nontender ?Skin: warm and dry with no masses, lesions, or rashes ?Psych: A&Ox4 with an appropriate affect ?Neuro: cranial nerves grossly intact, normal speech, thought process intact, moves all extremities, gait not assessed ? ? ?Results for orders placed or performed during the hospital encounter of 10/24/21 (from the past 48 hour(s))  ?Lipase, blood     Status: None  ? Collection Time: 10/24/21  3:58 AM  ?Result  Value Ref Range  ? Lipase 49 11 - 51 U/L  ?  Comment: Performed at [facility], [address] [place], [place] [address]  ?Comprehensive metabolic panel     Status: Abnormal  ? Collection Time: 10/24/21  3:58 AM  ?Result Value Ref Range  ? Sodium 138 135 - 145 mmol/L  ? Potassium 3.9 3.5 - 5.1 mmol/L  ? Chloride 105 98 - 111 mmol/L  ? CO2 25 22 - 32 mmol/L  ? Glucose, Bld 103 (H) 70 - 99 mg/dL  ?  Comment: Glucose reference range applies only to samples taken after fasting for at  least 8 hours.  ? BUN 17 6 - 20 mg/dL  ? Creatinine, Ser 0.91 0.44 - 1.00 mg/dL  ? Calcium 9.5 8.9 - 10.3 mg/dL  ? Total Protein 7.0 6.5 - 8.1 g/dL  ? Albumin 4.4 3.5 - 5.0 g/dL  ? AST 14 (L) 15 - 41 U/L  ? ALT 12 0 - 44 U/L  ? Alkaline Phosphatase 57 38 - 126 U/L  ? Total Bilirubin 0.5 0.3 - 1.2 mg/dL  ? GFR, Estimated >60 >60 mL/min  ?  Comment: (NOTE) ?Calculated using the CKD-EPI Creatinine Equation (2021) ?  ? Anion gap 8 5 - 15  ?  Comment: Performed at [facility], [address] [place], [place] [address]  ?CBC     Status: None  ? Collection Time: 10/24/21  3:58 AM  ?Result Value Ref Range  ? WBC 5.8 4.0 - 10.5 K/uL  ? RBC 4.37 3.87 - 5.11 MIL/uL  ? Hemoglobin 12.6 12.0 - 15.0 g/dL  ? HCT 38.4 36.0 - 46.0 %  ? MCV 87.9 80.0 - 100.0 fL  ? MCH 28.8 26.0 - 34.0 pg  ? MCHC 32.8 30.0 - 36.0 g/dL  ? RDW 13.0 11.5 - 15.5 %  ? Platelets 168 150 - 400 K/uL  ? nRBC 0.0 0.0 - 0.2 %  ?  Comment: Performed at [facility], [address] [place], [place] [address]  ?Urinalysis, Routine w reflex microscopic Urine, Clean Catch     Status: Abnormal  ? Collection Time: 10/24/21  3:58 AM  ?Result Value Ref Range  ? Color, Urine YELLOW YELLOW  ? APPearance CLOUDY (A) CLEAR  ? Specific Gravity, Urine 1.018 1.005 - 1.030  ? pH 6.0 5.0 - 8.0  ? Glucose, UA NEGATIVE NEGATIVE mg/dL  ? Hgb urine dipstick NEGATIVE NEGATIVE  ? Bilirubin Urine NEGATIVE NEGATIVE  ? Ketones, ur NEGATIVE NEGATIVE mg/dL  ? Protein, ur NEGATIVE NEGATIVE mg/dL  ? Nitrite NEGATIVE NEGATIVE  ? Leukocytes,Ua TRACE (A) NEGATIVE  ? RBC / HPF 0-5 0 - 5 RBC/hpf  ? WBC, UA 6-10 0 - 5 WBC/hpf  ? Bacteria, UA RARE (A) NONE SEEN  ? Squamous Epithelial / LPF 21-50 0 - 5  ?  Comment: Performed at [facility], [address] [place], [place] [address]  ?I-Stat beta hCG blood, ED     Status: None  ? Collection Time: 10/24/21  5:21 AM  ?Result Value Ref Range  ? I-stat hCG, quantitative <5.0 <5 mIU/mL  ? Comment 3          ?  Comment:   GEST. AGE       CONC.  (mIU/mL) ?  <=1 WEEK        5 - 50 ?    2 WEEKS       50 - 500 ?  3 WEEKS       100 - 10,000 ?    4 WEEKS     1,000 - 30,000 ?       ?FEMALE AND NON-PREGNANT FEMALE: ?    LESS THAN 5 mIU/mL ?  ? ?NM Hepato W/EF ? ?Result Date: 10/22/2021 ?CLINICAL DATA:  Nausea vomiting right upper quadrant pain EXAM: NUCLEAR MEDICINE HEPATOBILIARY IMAGING TECHNIQUE: Sequential images of the abdomen were obtained out to 60 minutes following intravenous administration of radiopharmaceutical. RADIOPHARMACEUTICALS:  5.12 mCi Tc-91m  Choletec IV, 3 mg morphine COMPARISON:  Ultrasound 10/13/2021 FINDINGS: Prompt uptake and biliary excretion of activity by the liver is seen. No gallbladder was visualized at the end of 1 hour. Patient was administered 3 mg of morphine and subsequent images were obtained. No definite gallbladder visualization despite morphine administration. There is reflux of activity into the stomach. IMPRESSION: Nonvisualized gallbladder despite morphine augmentation. Findings raise concern for an acute cholecystitis. These results will be called to the ordering clinician or representative by the Radiologist Assistant, and communication documented in the PACS or [employer]. Electronically Signed   By: Jasmine Pang M.D.   On: 10/22/2021 16:45  ? ?US Abdomen Limited RUQ (LIVER/GB) ? ?Result Date: 10/24/2021 ?CLINICAL DATA:  Right upper quadrant pain EXAM: ULTRASOUND ABDOMEN LIMITED RIGHT UPPER QUADRANT COMPARISON:  10/13/2021 FINDINGS: Gallbladder: Multiple small mobile shadowing stones are identified within the gallbladder measuring up to 5 mm. Cholesterol polyp is identified within the wall of the gallbladder measuring 3 mm. Gallbladder wall thickening is identified which measures up to 4 mm in thickness. No pericholecystic fluid or sonographic Murphy's sign. Common bile duct: Diameter: 4 mm. Liver: No focal lesion identified. Within normal limits in parenchymal echogenicity. Portal vein is patent on color  Doppler imaging with normal direction of blood flow towards the liver. Other: None. IMPRESSION: Gallstones and gallbladder wall thickening. Cannot rule out cholecystitis. Electronically Signed   By: Karie Schwalbe

## 2021-10-24 NOTE — Anesthesia Preprocedure Evaluation (Addendum)
Anesthesia Evaluation  ?Patient identified by MRN, date of birth, ID band ?Patient awake ? ? ? ?Reviewed: ?Allergy & Precautions, NPO status , Patient's Chart, lab work & pertinent test results ? ?Airway ?Mallampati: I ? ?TM Distance: >3 FB ?Neck ROM: Full ? ? ? Dental ?no notable dental hx. ? ?  ?Pulmonary ?neg pulmonary ROS,  ?  ?Pulmonary exam normal ? ? ? ? ? ? ? Cardiovascular ?negative cardio ROS ?Normal cardiovascular exam ? ? ?  ?Neuro/Psych ?Seizures -,  negative psych ROS  ? GI/Hepatic ?negative GI ROS, Neg liver ROS,   ?Endo/Other  ?negative endocrine ROS ? Renal/GU ?negative Renal ROS  ? ?  ?Musculoskeletal ?negative musculoskeletal ROS ?(+)  ? Abdominal ?  ?Peds ? Hematology ?negative hematology ROS ?(+)   ?Anesthesia Other Findings ?Cholecystitis ? Reproductive/Obstetrics ?hcg negative ? ?  ? ? ? ? ? ? ? ? ? ? ? ? ? ?  ?  ? ? ? ? ? ? ? ?Anesthesia Physical ?Anesthesia Plan ? ?ASA: 2 ? ?Anesthesia Plan: General  ? ?Post-op Pain Management:   ? ?Induction: Intravenous ? ?PONV Risk Score and Plan: 4 or greater and Ondansetron, Dexamethasone, Midazolam, Scopolamine patch - Pre-op and Treatment may vary due to age or medical condition ? ?Airway Management Planned: Oral ETT ? ?Additional Equipment:  ? ?Intra-op Plan:  ? ?Post-operative Plan: Extubation in OR ? ?Informed Consent: I have reviewed the patients History and Physical, chart, labs and discussed the procedure including the risks, benefits and alternatives for the proposed anesthesia with the patient or authorized representative who has indicated his/her understanding and acceptance.  ? ? ? ?Dental advisory given ? ?Plan Discussed with: CRNA ? ?Anesthesia Plan Comments:   ? ? ? ? ? ?Anesthesia Quick Evaluation ? ?

## 2021-10-25 ENCOUNTER — Encounter (HOSPITAL_COMMUNITY): Payer: Self-pay | Admitting: Surgery

## 2021-10-26 LAB — SURGICAL PATHOLOGY

## 2021-11-11 DIAGNOSIS — K8000 Calculus of gallbladder with acute cholecystitis without obstruction: Secondary | ICD-10-CM | POA: Diagnosis not present

## 2022-04-05 NOTE — Patient Instructions (Signed)
Below is our plan:  We will continue levetiracetam 500mg  twice daily   Please make sure you are staying well hydrated. I recommend 50-60 ounces daily. Well balanced diet and regular exercise encouraged. Consistent sleep schedule with 6-8 hours recommended.   Please continue follow up with care team as directed.   Follow up with me in 1 year   You may receive a survey regarding today's visit. I encourage you to leave honest feed back as I do use this information to improve patient care. Thank you for seeing me today!

## 2022-04-05 NOTE — Progress Notes (Unsigned)
PATIENT: Kaitlyn Kim DOB: [birth year]  REASON FOR VISIT: follow up HISTORY FROM: patient  No chief complaint on file.    HISTORY OF PRESENT ILLNESS:  04/05/22 ALL: Kaitlyn Kim returns for follow up for seizures. She continues levetiracetam 500mg  BID.   12/29/2020 ALL (mychart): Kaitlyn Kim is a 26 y.o. female here today for follow up for seizures. She continues levetiracetam 500mg  BID. She is doing well, today. She recently delivered her first child 11/12/20. She delivered a healthy baby girl via vaginal birth without complications. She and grace are doing well. She is living with her parents who are very supportive. She is a single mother. She is sleeping well and staying well hydrated.   10/02/2019 ALL: Kaitlyn Kim is a 26 y.o. female here today for follow up for seizures. She continues levetiracetam 500mg  twice daily. She is tolerating medications well with no obvious adverse effects. She continues to work full time. She is feeling well today and without complaints.   HISTORY: (copied from Dr [name] note on 11/08/2017)  Interval history 11/08/2017: Very stable, no issues, no seizures, good compliance, doing great. She works at [employer].    Interval history 11/08/2016: Patient returns today for follow-up of seizures since age of 89. She has a history of Seizures and Generalized Tonic-Clonic Seizures. She Was Started on Keppra. Juvenile Myoclonic Epilepsy Is in the Differential. I Last Appointment We Continued Her on Keppra, Discussed Lamictal Is a Good Alternative. She is under stress. Doing well on Keppra no more mood swings. No seizures or staring spells, no jerking movements. We discussed Pradhan staying on Keppra. If she ever has any side effects to the Keppra or changes in mood we can switch to Lamictal. She reports some memory changes which are not uncommon on epilepsy medications we will monitor clinically.   HPI:  Kaitlyn Kim is a 26 y.o. female here as a referral from Dr.  03-16-1976 for seizures. Patient has a past medical history of late onset absence seizures since the age of 67. She presented to [facility] February, earlier this month, not on any antiepileptics he medications after having episodes of seizures. She started making grunting noises followed by 20-30 seconds of tonic-clonic seizure. He was postictal afterwards. She appeared to bite her tongue and was having trouble breathing. Patient had been having morning seizures for over a month. Seizures started her at 71-19. She had been on Lamictal in the past with side effects. She was switched to Keppra have been off this medication for several years. She did see [facility] in the past.She was restarted on Keppra. She is here with parents who also provide information. She had a [facility] in 2015, then January 2017 and then [facility] and in December started increasing in frequency. She screams, head back, eyes wide open, goes on for 1-2 minutes. She had 3 in a row and went to the ED. She is irritable on keppra.    Reviewed notes, labs and imaging from outside physicians, which showed:   Reviewed notes from pediatric neurology.Patient was diagnosed with SEIZURES AT THE AGE OF 11. She was trialed on multiple medications with side effects including Keppra caused aggressiveness, ethosuximide GI upset and panic, lamotrigine was also poorly tolerated.The patient had onset of seizures in 2008.  EEG in January 18, 2008, showed spike and slow wave discharges of 3 Hz and bitemporal slowing. The patient had EEG on June 10, 2011, and on the basis of that, decision was made to taper and discontinue  levetiracetam.       REVIEW OF SYSTEMS: Out of a complete 14 system review of symptoms, the patient complains only of the following symptoms, none and all other reviewed systems are negative.   ALLERGIES: No Known Allergies  HOME MEDICATIONS: Outpatient Medications Prior to Visit  Medication Sig Dispense Refill   levETIRAcetam (KEPPRA)  500 MG tablet Take 1 tablet (500 mg total) by mouth 2 (two) times daily. 180 tablet 4   pantoprazole (PROTONIX) 40 MG tablet Take 40 mg by mouth every morning.     No facility-administered medications prior to visit.    PAST MEDICAL HISTORY: Past Medical History:  Diagnosis Date   Seizures (HCC)    HX of seizures 5 years ago    PAST SURGICAL HISTORY: Past Surgical History:  Procedure Laterality Date   CHOLECYSTECTOMY N/A 10/24/2021   Procedure: LAPAROSCOPIC CHOLECYSTECTOMY;  Surgeon: Berna Bue, MD;  Location: [facility] OR;  Service: General;  Laterality: N/A;   LACERATION REPAIR  1998   forehead   NO PAST SURGERIES     VAGINAL DELIVERY      FAMILY HISTORY: Family History  Problem Relation Age of Onset   Hyperlipidemia Mother    Thyroid disease Mother    Hypertension Maternal Grandmother    ALS Maternal Grandmother    Cancer Paternal Grandmother        lung cancer   COPD Paternal Grandmother    COPD Paternal Grandfather    Seizures Neg Hx     SOCIAL HISTORY: Social History   Socioeconomic History   Marital status: Single    Spouse name: n/a   Number of children: 0   Years of education: 17   Highest education level: Not on file  Occupational History   Occupation: Consulting civil engineer    Comment: [employer], psychology and criminology   Occupation: [employer]  Tobacco Use   Smoking status: Never   Smokeless tobacco: Never  Vaping Use   Vaping Use: Former  Substance and Sexual Activity   Alcohol use: Not Currently    Comment: Patient rarely drinks alcohol.   Drug use: No   Sexual activity: Not Currently  Other Topics Concern   Not on file  Social History Narrative   Lives with parents   Brother is currently in jail Electronics engineer, [place] 10/2015)   Right-handed   Caffeine: none   Social Determinants of Health   Financial Resource Strain: Not on file  Food Insecurity: Not on file  Transportation Needs: Not on file  Physical Activity: Not on file  Stress: Not on file  Social  Connections: Not on file  Intimate Partner Violence: Not on file      PHYSICAL EXAM  There were no vitals filed for this visit.  There is no height or weight on file to calculate BMI.  Generalized: Well developed, in no acute distress  Cardiology: normal rate and rhythm, no murmur noted Respiratory: clear to auscultation bilaterally  Neurological examination  Mentation: Alert oriented to time, place, history taking. Follows all commands speech and language fluent Cranial nerve II-XII: Pupils were equal round reactive to light. Extraocular movements were full, visual field were full on confrontational test. Facial sensation and strength were normal. Uvula tongue midline. Head turning and shoulder shrug  were normal and symmetric. Motor: The motor testing reveals 5 over 5 strength of all 4 extremities. Good symmetric motor tone is noted throughout.  Sensory: Sensory testing is intact to soft touch on all 4 extremities. No evidence of extinction  is noted.  Coordination: Cerebellar testing reveals good finger-nose-finger and heel-to-shin bilaterally.  Gait and station: Gait is normal.    DIAGNOSTIC DATA (LABS, IMAGING, TESTING) - I reviewed patient records, labs, notes, testing and imaging myself where available.      No data to display           Lab Results  Component Value Date   WBC 5.8 10/24/2021   HGB 12.6 10/24/2021   HCT 38.4 10/24/2021   MCV 87.9 10/24/2021   PLT 168 10/24/2021      Component Value Date/Time   NA 138 10/24/2021 0358   K 3.9 10/24/2021 0358   CL 105 10/24/2021 0358   CO2 25 10/24/2021 0358   GLUCOSE 103 (H) 10/24/2021 0358   BUN 17 10/24/2021 0358   CREATININE 0.91 10/24/2021 0358   CREATININE 0.74 07/10/2014 1728   CALCIUM 9.5 10/24/2021 0358   PROT 7.0 10/24/2021 0358   ALBUMIN 4.4 10/24/2021 0358   AST 14 (L) 10/24/2021 0358   ALT 12 10/24/2021 0358   ALKPHOS 57 10/24/2021 0358   BILITOT 0.5 10/24/2021 0358   GFRNONAA >60 10/24/2021  0358   GFRAA >60 07/25/2016 0138   No results found for: "CHOL", "HDL", "LDLCALC", "LDLDIRECT", "TRIG", "CHOLHDL" No results found for: "HGBA1C" No results found for: "VITAMINB12" No results found for: "TSH"     ASSESSMENT AND PLAN 26 y.o. year old female  has a past medical history of Seizures ([place]). here with   No diagnosis found.   Kaitlyn Kim is doing very well today.  She continues levetiracetam 500 mg twice daily.  No seizure activity since 2018.  She is tolerating medications well.  We will continue current therapy.  She was encouraged to stay well-hydrated.  I have advised that she follow-up with primary care yearly for CPE.  She will return to see [place] in 1 year, sooner if needed.  She verbalizes understanding and agreement with this plan.   No orders of the defined types were placed in this encounter.    No orders of the defined types were placed in this encounter.     I spent 15 minutes with the patient. 50% of this time was spent counseling and educating patient on plan of care and medications.    Debbora Presto, FNP-C 04/05/2022, 3:54 PM [facility] [address] [place], [place] [address] [phone]

## 2022-04-06 ENCOUNTER — Ambulatory Visit: Payer: BC Managed Care – PPO | Admitting: Family Medicine

## 2022-04-06 ENCOUNTER — Encounter: Payer: Self-pay | Admitting: Family Medicine

## 2022-04-06 VITALS — BP 104/69 | HR 82 | Ht 64.0 in | Wt 124.0 lb

## 2022-04-06 DIAGNOSIS — G40B09 Juvenile myoclonic epilepsy, not intractable, without status epilepticus: Secondary | ICD-10-CM

## 2022-04-06 MED ORDER — LEVETIRACETAM 500 MG PO TABS: 500.0000 mg | ORAL_TABLET | Freq: Two times a day (BID) | ORAL | 4 refills | Status: DC

## 2023-04-03 NOTE — Progress Notes (Unsigned)
PATIENT: Kaitlyn Kim DOB: 1996/03/25  REASON FOR VISIT: follow up HISTORY FROM: patient  Virtual Visit via Telephone Note  I connected with Kaitlyn Kim on 04/04/23 at  9:45 AM EDT by telephone and verified that I am speaking with the correct person using two identifiers.   I discussed the limitations, risks, security and privacy concerns of performing an evaluation and management service by telephone and the availability of in person appointments. I also discussed with the patient that there may be a patient responsible charge related to this service. The patient expressed understanding and agreed to proceed.   History of Present Illness:  04/04/23 ALL (Mychart): Kaitlyn Kim returns for follow up for seizures. She continues lev 500mg  BID. She is tolerating med well. No seizures. Last seizure in 2017. She does not have PCP at this time. She is feeling well and without complaints.   04/06/22 ALL: Kaitlyn Kim returns for follow up for seizures. She continues levetiracetam 500mg  BID. She is doing well. No seizure activity. She is tolerating meds well.   12/29/2020 ALL: Kaitlyn Kim is a 27 y.o. female here today for follow up for seizures. She continues levetiracetam 500mg  BID. She is doing well, today. She recently delivered her first child 11/12/20. She delivered a healthy baby girl via vaginal birth without complications. She and Sheran Lawless grace are doing well. She is living with her parents who are very supportive. She is a single mother. She is sleeping well and staying well hydrated.   10/02/19 ALL:  Kaitlyn Kim is a 27 y.o. female here today for follow up for seizures. She continues levetiracetam 500mg  twice daily. She is tolerating medications well with no obvious adverse effects. She continues to work full time. She is feeling well today and without complaints.    HISTORY: (copied from Dr Trevor Mace note on 11/08/2017)   Interval history 11/08/2017: Very stable, no issues, no seizures, good  compliance, doing great. She works at [employer].    Interval history 11/08/2016: Patient returns today for follow-up of seizures since age of 29. She has a history of Seizures and Generalized Tonic-Clonic Seizures. She Was Started on Keppra. Juvenile Myoclonic Epilepsy Is in the Differential. I Last Appointment We Continued Her on Keppra, Discussed Lamictal Is a Good Alternative. She is under stress. Doing well on Keppra no more mood swings. No seizures or staring spells, no jerking movements. We discussed Pradhan staying on Keppra. If she ever has any side effects to the Keppra or changes in mood we can switch to Lamictal. She reports some memory changes which are not uncommon on epilepsy medications we will monitor clinically.   HPI:  Kaitlyn Kim is a 27 y.o. female here as a referral from Dr. Chestine Spore for seizures. Patient has a past medical history of late onset absence seizures since the age of 79. She presented to [facility] February, earlier this month, not on any antiepileptics he medications after having episodes of seizures. She started making grunting noises followed by 20-30 seconds of tonic-clonic seizure. He was postictal afterwards. She appeared to bite her tongue and was having trouble breathing. Patient had been having morning seizures for over a month. Seizures started her at 69-19. She had been on Lamictal in the past with side effects. She was switched to Keppra have been off this medication for several years. She did see Dr. Sharene Skeans in the past.She was restarted on Keppra. She is here with parents who also provide information. She had a GTCS in 2015, then January 2017  and then [facility] and in December started increasing in frequency. She screams, head back, eyes wide open, goes on for 1-2 minutes. She had 3 in a row and went to the ED. She is irritable on keppra.   Reviewed notes, labs and imaging from outside physicians, which showed:   Reviewed notes from pediatric neurology.Patient was  diagnosed with SEIZURES AT THE AGE OF 11. She was trialed on multiple medications with side effects including Keppra caused aggressiveness, ethosuximide GI upset and panic, lamotrigine was also poorly tolerated.The patient had onset of seizures in 2008.  EEG in January 18, 2008, showed spike and slow wave discharges of 3 Hz and bitemporal slowing. The patient had EEG on June 10, 2011, and on the basis of that, decision was made to taper and discontinue levetiracetam.   Observations/Objective:  Generalized: Well developed, in no acute distress  Mentation: Alert oriented to time, place, history taking. Follows all commands speech and language fluent   Assessment and Plan:  27 y.o. year old female  has a past medical history of Seizures (HCC). here with    ICD-10-CM   1. Generalized seizure disorder (HCC)  G40.309     2. Nonintractable juvenile myoclonic epilepsy without status epilepticus (HCC)  G40.B10       [name] is doing well. She will continue levetiracetam 500mg  BID. I have encouraged her to establish care with PCP. She will follow up with me in 1 year. She verbalizes understanding and agreement with this plan.   No orders of the defined types were placed in this encounter.    Meds ordered this encounter  Medications   levETIRAcetam (KEPPRA) 500 MG tablet    Sig: Take 1 tablet (500 mg total) by mouth 2 (two) times daily.    Dispense:  180 tablet    Refill:  4    Order Specific Question:   Supervising Provider    Answer:   Anson Fret [id number]      Follow Up Instructions:  I discussed the assessment and treatment plan with the patient. The patient was provided an opportunity to ask questions and all were answered. The patient agreed with the plan and demonstrated an understanding of the instructions.   The patient was advised to call back or seek an in-person evaluation if the symptoms worsen or if the condition fails to improve as anticipated.  I provided 20  minutes of non-face-to-face time during this encounter. Patient located at their place of residence during Mychart visit. Provider is in the office.    Shawnie Dapper, NP

## 2023-04-04 ENCOUNTER — Telehealth (INDEPENDENT_AMBULATORY_CARE_PROVIDER_SITE_OTHER): Payer: Commercial Managed Care - PPO | Admitting: Family Medicine

## 2023-04-04 ENCOUNTER — Encounter: Payer: Self-pay | Admitting: Family Medicine

## 2023-04-04 DIAGNOSIS — G40B09 Juvenile myoclonic epilepsy, not intractable, without status epilepticus: Secondary | ICD-10-CM | POA: Diagnosis not present

## 2023-04-04 DIAGNOSIS — G40309 Generalized idiopathic epilepsy and epileptic syndromes, not intractable, without status epilepticus: Secondary | ICD-10-CM

## 2023-04-04 MED ORDER — LEVETIRACETAM 500 MG PO TABS: 500.0000 mg | ORAL_TABLET | Freq: Two times a day (BID) | ORAL | 4 refills | Status: DC

## 2023-04-04 NOTE — Patient Instructions (Signed)
Below is our plan:  We will continue levetiracetam 500mg  twice daily.   Please make sure you are consistent with timing of seizure medication. I recommend annual visit with primary care provider (PCP) for complete physical and routine blood work. I recommend daily intake of vitamin D (400-800iu) and calcium (800-1000mg ) for bone health. Discuss Dexa screening with PCP.   According to [place] law, you can not drive unless you are seizure / syncope free for at least 6 months and under physician's care.  Please maintain precautions. Do not participate in activities where a loss of awareness could harm you or someone else. No swimming alone, no tub bathing, no hot tubs, no driving, no operating motorized vehicles (cars, ATVs, motocycles, etc), lawnmowers, power tools or firearms. No standing at heights, such as rooftops, ladders or stairs. Avoid hot objects such as stoves, heaters, open fires. Wear a helmet when riding a bicycle, scooter, skateboard, etc. and avoid areas of traffic. Set your water heater to 120 degrees or less.  SUDEP is the sudden, unexpected death of someone with epilepsy, who was otherwise healthy. In SUDEP cases, no other cause of death is found when an autopsy is done. Each year, more than 1 in 1,000 people with epilepsy die from SUDEP. This is the leading cause of death in people with uncontrolled seizures. Until further answers are available, the best way to prevent SUDEP is to lower your risk by controlling seizures. Research has found that people with all types of epilepsy that experience convulsive seizures can be at risk.  Please make sure you are staying well hydrated. I recommend 50-60 ounces daily. Well balanced diet and regular exercise encouraged. Consistent sleep schedule with 6-8 hours recommended.   Please continue follow up with care team as directed.   Follow up with me in 1 year   You may receive a survey regarding today's visit. I encourage you to leave honest feed  back as I do use this information to improve patient care. Thank you for seeing me today!

## 2024-01-27 IMAGING — NM NM HEPATO W/GB/PHARM/[PERSON_NAME]
5 series · 20 of 20 positions shown · non-contrast
Comparison: Ultrasound 10/13/2021

CLINICAL DATA: Nausea vomiting right upper quadrant pain

EXAM:
NUCLEAR MEDICINE HEPATOBILIARY IMAGING
TECHNIQUE: Sequential images of the abdomen were obtained [DATE] minutes
following intravenous administration of radiopharmaceutical.
RADIOPHARMACEUTICALS:  5.12 mCi 4c-YYm  Choletec IV, 3 mg morphine

[Series 1: biliary · 4.14mm/px · 6 of 55 frames shown (1 of 2)]
[frame 5/55]
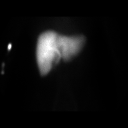
[frame 14/55]
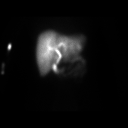
[frame 23/55]
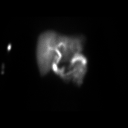
[frame 32/55]
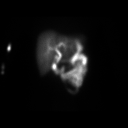
[frame 41/55]
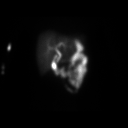
[frame 51/55]
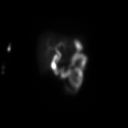

[Series 2: rt lat · 4.14mm/px · 1 of 1 slices shown]
[im 1/1]
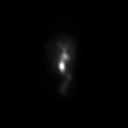

[Series 3: biliary · 4.14mm/px · 6 of 23 frames shown (2 of 2)]
[frame 2/23]
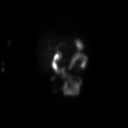
[frame 6/23]
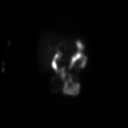
[frame 10/23]
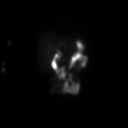
[frame 14/23]
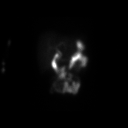
[frame 18/23]
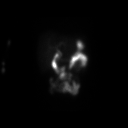
[frame 22/23]
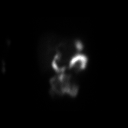

[Series 4: anterior 4 hour delay · delayed · 4.14mm/px · 1 of 1 slices shown]
[im 1/1]
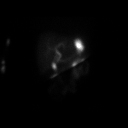

[Series 5: gbef · 4.14mm/px · 6 of 30 frames shown]
[frame 3/30]
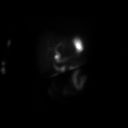
[frame 8/30]
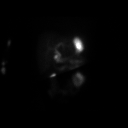
[frame 13/30]
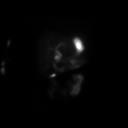
[frame 18/30]
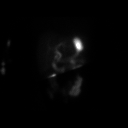
[frame 23/30]
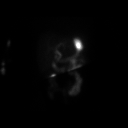
[frame 28/30]
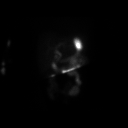

[20 of 20 positions shown; findings below may reference images not displayed]

FINDINGS: Prompt uptake and biliary excretion of activity by the liver is
seen. No gallbladder was visualized at the end of 1 hour. Patient
was administered 3 mg of morphine and subsequent images were
obtained. No definite gallbladder visualization despite morphine
administration. There is reflux of activity into the stomach.
IMPRESSION: Nonvisualized gallbladder despite morphine augmentation. Findings
raise concern for an acute cholecystitis.

These results will be called to the ordering clinician or
representative by the Radiologist Assistant, and communication
documented in the PACS or [REDACTED].

## 2024-01-29 IMAGING — US US ABDOMEN LIMITED
1 series · 14 of 25 positions shown · non-contrast
Comparison: 10/13/2021

CLINICAL DATA: Right upper quadrant pain

EXAM:
ULTRASOUND ABDOMEN LIMITED RIGHT UPPER QUADRANT

[Series 1: us abdomen limited ruq (liver/gb) · 14 of 74 slices shown]
[im 1/74]
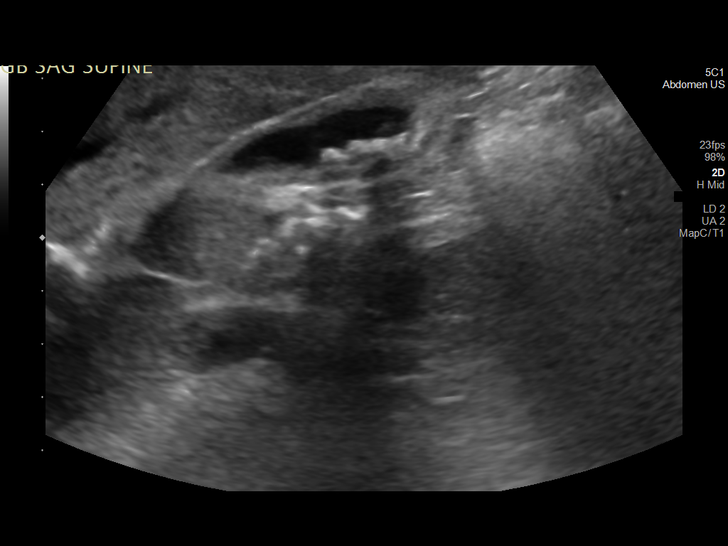
[im 7/74]
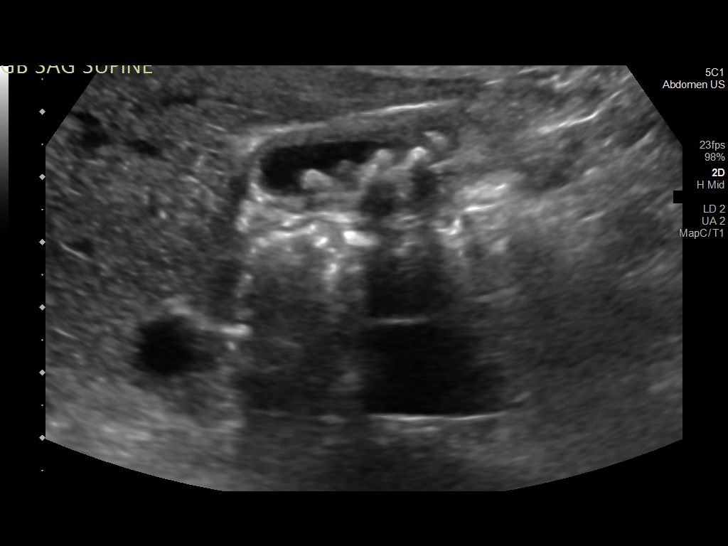
[im 13/74]
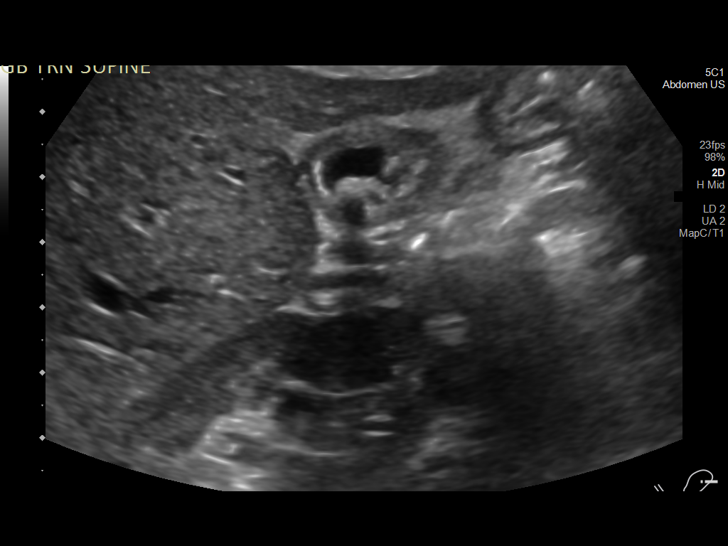
[im 19/74]
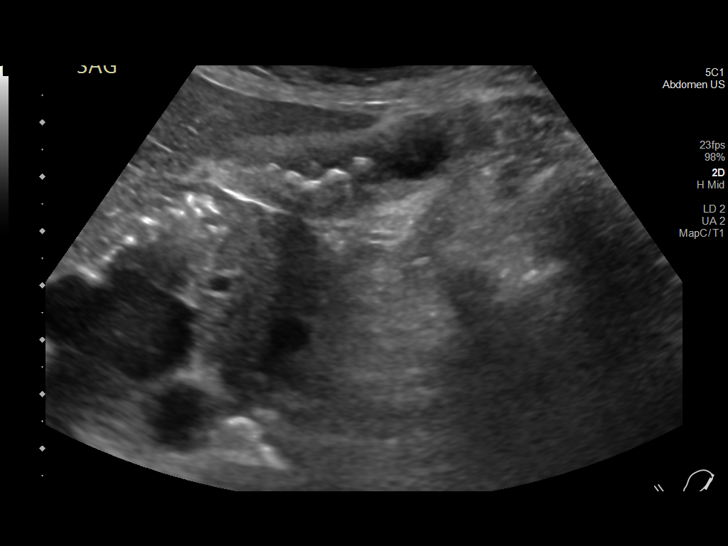
[im 25/74]
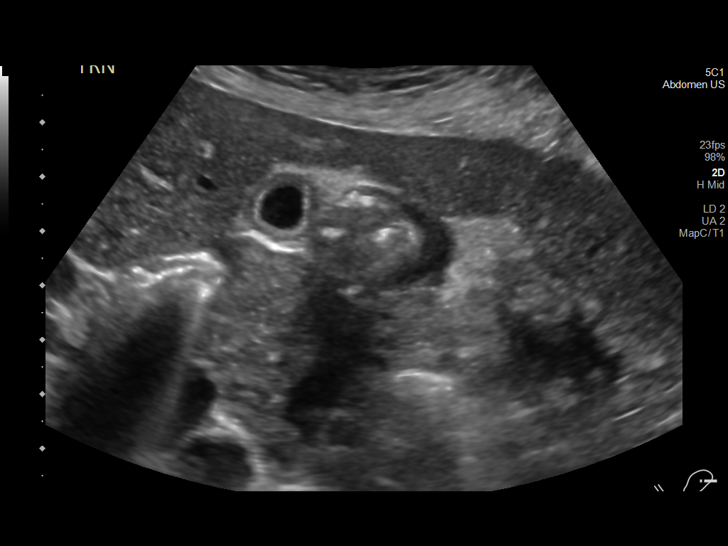
[im 28/74]
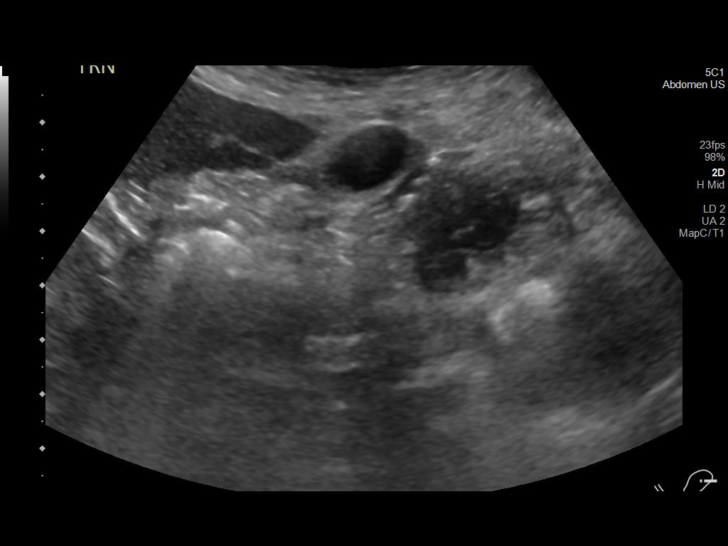
[im 34/74]
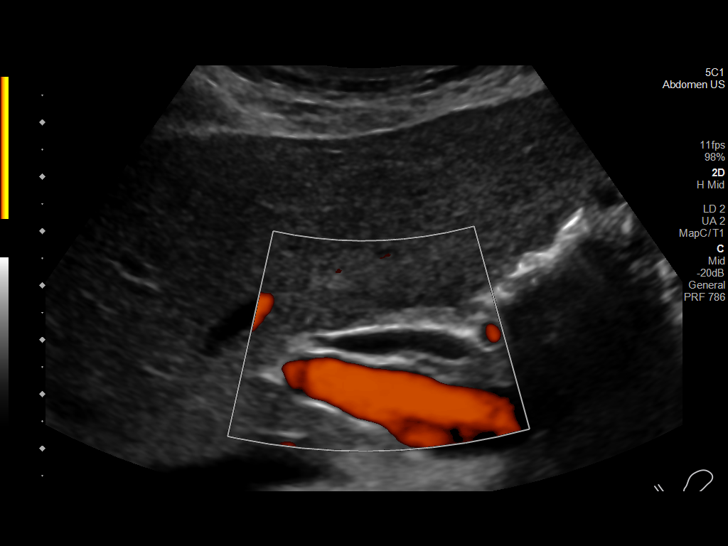
[im 40/74]
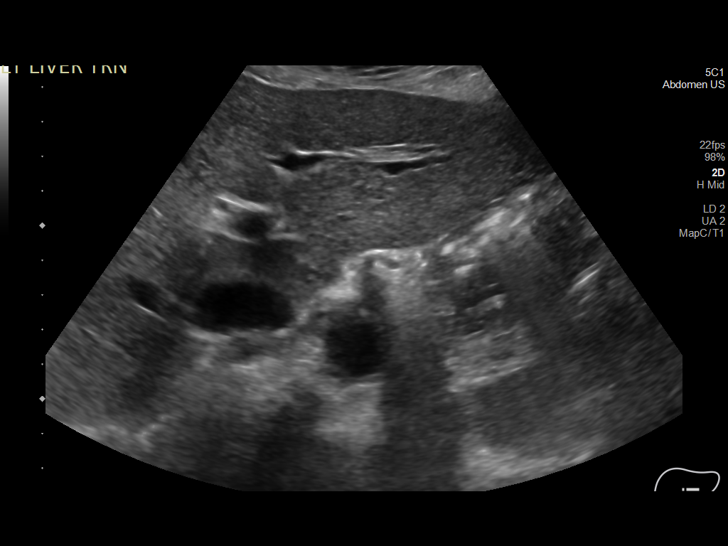
[im 46/74]
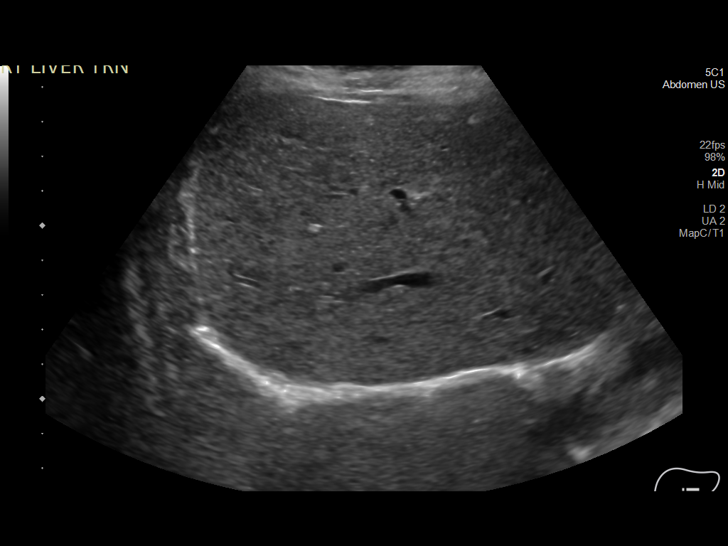
[im 49/74]
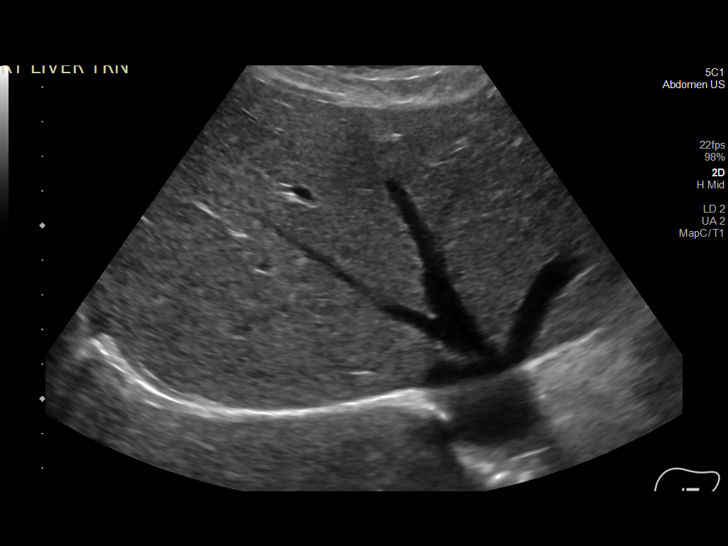
[im 55/74]
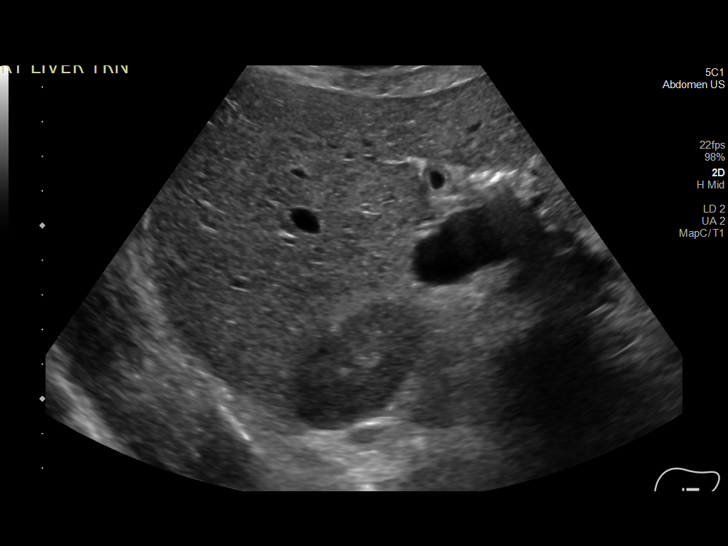
[im 61/74]
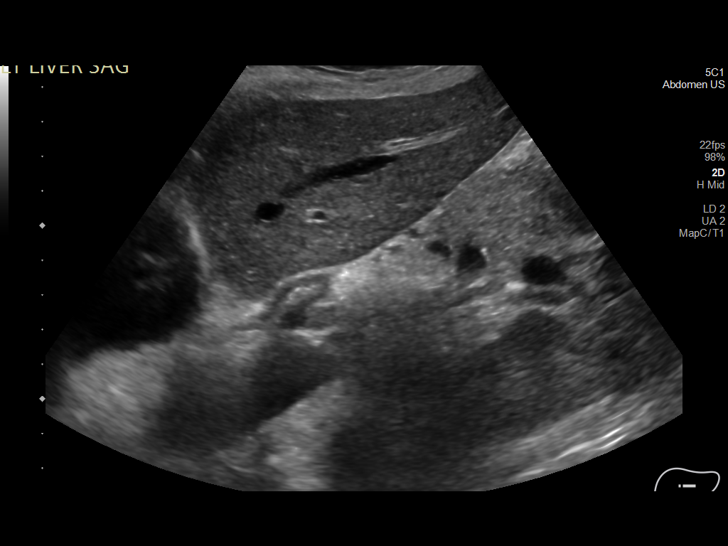
[im 67/74]
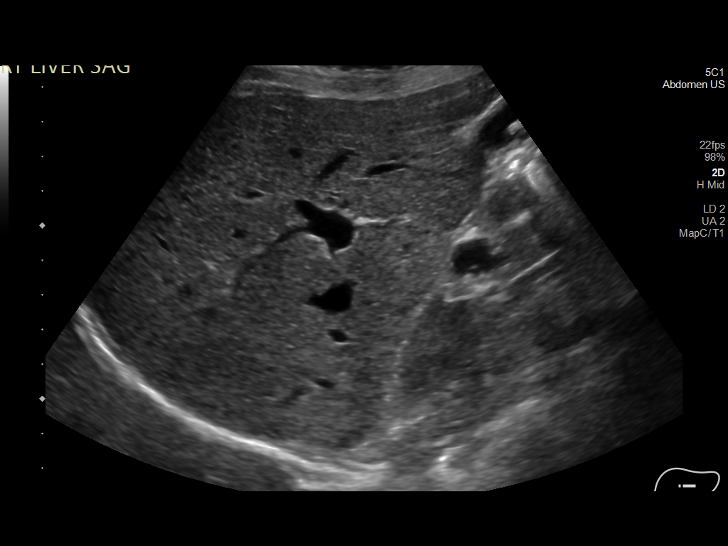
[im 74/74]
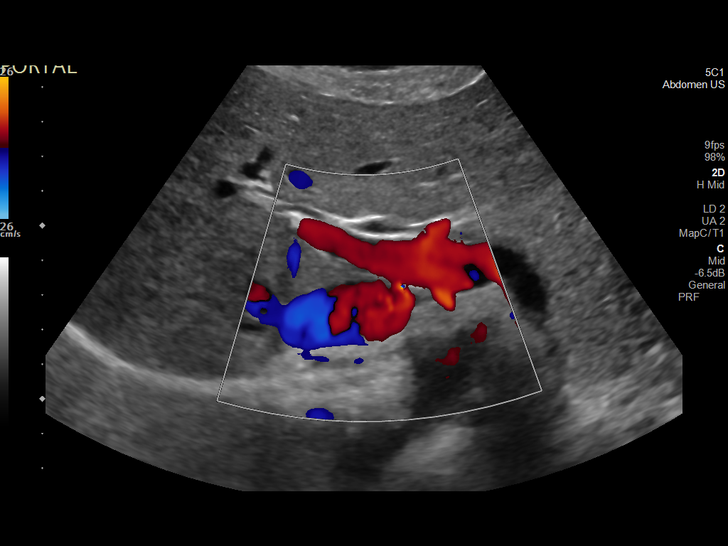

[14 of 25 positions shown; findings below may reference images not displayed]

FINDINGS: Gallbladder:

Multiple small mobile shadowing stones are identified within the
gallbladder measuring up to 5 mm. Cholesterol polyp is identified
within the wall of the gallbladder measuring 3 mm. Gallbladder wall
thickening is identified which measures up to 4 mm in thickness. No
pericholecystic fluid or sonographic Murphy's sign.

Common bile duct:

Diameter: 4 mm.

Liver:

No focal lesion identified. Within normal limits in parenchymal
echogenicity. Portal vein is patent on color Doppler imaging with
normal direction of blood flow towards the liver.

Other: None.
IMPRESSION: Gallstones and gallbladder wall thickening. Cannot rule out
cholecystitis.

## 2024-04-03 NOTE — Progress Notes (Signed)
 PATIENT: Kaitlyn Kim DOB: April 20, 1996  REASON FOR VISIT: follow up HISTORY FROM: patient  Virtual Visit via Telephone Note  I connected with Bernarda Kiang on 04/08/24 at  9:45 AM EDT by telephone and verified that I am speaking with the correct person using two identifiers.   I discussed the limitations, risks, security and privacy concerns of performing an evaluation and management service by telephone and the availability of in person appointments. I also discussed with the patient that there may be a patient responsible charge related to this service. The patient expressed understanding and agreed to proceed.   History of Present Illness:  04/08/24 ALL (Mychart): Kaitlyn Kim returns for follow up for seizures. She continues levetiracetam  500mg  BID. She is tolerating it well with no obvious adverse effects. She denies seizure activity. She is followed by GYN annually. No PCP.   04/04/2023 ALL (Mychart):  [name] returns for follow up for seizures. She continues lev 500mg  BID. She is tolerating med well. No seizures. Last seizure in 2017. She does not have PCP at this time. She is feeling well and without complaints.   04/06/22 ALL: Kaitlyn Kim returns for follow up for seizures. She continues levetiracetam  500mg  BID. She is doing well. No seizure activity. She is tolerating meds well.   12/29/2020 ALL: Kaitlyn Kim is a 28 y.o. female here today for follow up for seizures. She continues levetiracetam  500mg  BID. She is doing well, today. She recently delivered her first child 11/12/20. She delivered a healthy baby girl via vaginal birth without complications. She and Sharyne grace are doing well. She is living with her parents who are very supportive. She is a single mother. She is sleeping well and staying well hydrated.   10/02/19 ALL:  Kaitlyn Kim is a 28 y.o. female here today for follow up for seizures. She continues levetiracetam  500mg  twice daily. She is tolerating medications well with no  obvious adverse effects. She continues to work full time. She is feeling well today and without complaints.    HISTORY: (copied from Dr Sharion note on 11/08/2017)   Interval history 11/08/2017: Very stable, no issues, no seizures, good compliance, doing great. She works at [employer].    Interval history 11/08/2016: Patient returns today for follow-up of seizures since age of 89. She has a history of Seizures and Generalized Tonic-Clonic Seizures. She Was Started on Keppra . Juvenile Myoclonic Epilepsy Is in the Differential. I Last Appointment We Continued Her on Keppra , Discussed Lamictal Is a Good Alternative. She is under stress. Doing well on Keppra  no more mood swings. No seizures or staring spells, no jerking movements. We discussed Pradhan staying on Keppra . If she ever has any side effects to the Keppra  or changes in mood we can switch to Lamictal. She reports some memory changes which are not uncommon on epilepsy medications we will monitor clinically.   HPI:  Kaitlyn Kim is a 28 y.o. female here as a referral from Dr. Gretta for seizures. Patient has a past medical history of late onset absence seizures since the age of 35. She presented to [facility] February, earlier this month, not on any antiepileptics he medications after having episodes of seizures. She started making grunting noises followed by 20-30 seconds of tonic-clonic seizure. He was postictal afterwards. She appeared to bite her tongue and was having trouble breathing. Patient had been having morning seizures for over a month. Seizures started her at 20-19. She had been on Lamictal in the past with side effects. She was switched  to Keppra  have been off this medication for several years. She did see Dr. Susen in the past.She was restarted on Keppra . She is here with parents who also provide information. She had a GTCS in 2015, then January 2017 and then Novemeber and in December started increasing in frequency. She screams, head back,  eyes wide open, goes on for 1-2 minutes. She had 3 in a row and went to the ED. She is irritable on keppra .   Reviewed notes, labs and imaging from outside physicians, which showed:   Reviewed notes from pediatric neurology.Patient was diagnosed with SEIZURES AT THE AGE OF 11. She was trialed on multiple medications with side effects including Keppra  caused aggressiveness, ethosuximide GI upset and panic, lamotrigine was also poorly tolerated.The patient had onset of seizures in 2008.  EEG in January 18, 2008, showed spike and slow wave discharges of 3 Hz and bitemporal slowing. The patient had EEG on June 10, 2011, and on the basis of that, decision was made to taper and discontinue levetiracetam .   Observations/Objective:  Generalized: Well developed, in no acute distress  Mentation: Alert oriented to time, place, history taking. Follows all commands speech and language fluent   Assessment and Plan:  28 y.o. year old female  has a past medical history of Seizures (HCC). here with    ICD-10-CM   1. Generalized seizure disorder (HCC)  G40.309     2. Nonintractable juvenile myoclonic epilepsy without status epilepticus (HCC)  G40.B50        Kaitlyn Kim is doing well. She will continue levetiracetam  500mg  BID. I have encouraged her to establish care with PCP. Can check labs here if she wishes. She will follow up with me in 1 year. She verbalizes understanding and agreement with this plan.   No orders of the defined types were placed in this encounter.    Meds ordered this encounter  Medications   levETIRAcetam  (KEPPRA ) 500 MG tablet    Sig: Take 1 tablet (500 mg total) by mouth 2 (two) times daily.    Dispense:  180 tablet    Refill:  4    Supervising Provider:   YAN, YIJUN [[id number]]      Follow Up Instructions:  I discussed the assessment and treatment plan with the patient. The patient was provided an opportunity to ask questions and all were answered. The patient agreed with  the plan and demonstrated an understanding of the instructions.   The patient was advised to call back or seek an in-person evaluation if the symptoms worsen or if the condition fails to improve as anticipated.  I provided 15 minutes of non-face-to-face time during this encounter. Patient located at their place of residence during Mychart visit. Provider is in the office.    Valeria Krisko, NP

## 2024-04-03 NOTE — Patient Instructions (Signed)
Below is our plan:  We will continue levetiracetam 500mg  twice daily.   Please make sure you are consistent with timing of seizure medication. I recommend annual visit with primary care provider (PCP) for complete physical and routine blood work. I recommend daily intake of vitamin D (400-800iu) and calcium (800-1000mg ) for bone health. Discuss Dexa screening with PCP.   According to [place] law, you can not drive unless you are seizure / syncope free for at least 6 months and under physician's care.  Please maintain precautions. Do not participate in activities where a loss of awareness could harm you or someone else. No swimming alone, no tub bathing, no hot tubs, no driving, no operating motorized vehicles (cars, ATVs, motocycles, etc), lawnmowers, power tools or firearms. No standing at heights, such as rooftops, ladders or stairs. Avoid hot objects such as stoves, heaters, open fires. Wear a helmet when riding a bicycle, scooter, skateboard, etc. and avoid areas of traffic. Set your water heater to 120 degrees or less.  SUDEP is the sudden, unexpected death of someone with epilepsy, who was otherwise healthy. In SUDEP cases, no other cause of death is found when an autopsy is done. Each year, more than 1 in 1,000 people with epilepsy die from SUDEP. This is the leading cause of death in people with uncontrolled seizures. Until further answers are available, the best way to prevent SUDEP is to lower your risk by controlling seizures. Research has found that people with all types of epilepsy that experience convulsive seizures can be at risk.  Please make sure you are staying well hydrated. I recommend 50-60 ounces daily. Well balanced diet and regular exercise encouraged. Consistent sleep schedule with 6-8 hours recommended.   Please continue follow up with care team as directed.   Follow up with me in 1 year   You may receive a survey regarding today's visit. I encourage you to leave honest feed  back as I do use this information to improve patient care. Thank you for seeing me today!

## 2024-04-08 ENCOUNTER — Telehealth: Payer: Commercial Managed Care - PPO | Admitting: Family Medicine

## 2024-04-08 ENCOUNTER — Encounter: Payer: Self-pay | Admitting: Family Medicine

## 2024-04-08 DIAGNOSIS — G40B09 Juvenile myoclonic epilepsy, not intractable, without status epilepticus: Secondary | ICD-10-CM | POA: Diagnosis not present

## 2024-04-08 DIAGNOSIS — G40309 Generalized idiopathic epilepsy and epileptic syndromes, not intractable, without status epilepticus: Secondary | ICD-10-CM | POA: Diagnosis not present

## 2024-04-08 MED ORDER — LEVETIRACETAM 500 MG PO TABS: 500.0000 mg | ORAL_TABLET | Freq: Two times a day (BID) | ORAL | 4 refills | Status: AC

## 2024-04-21 ENCOUNTER — Telehealth: Admitting: Physician Assistant

## 2024-04-21 DIAGNOSIS — B3731 Acute candidiasis of vulva and vagina: Secondary | ICD-10-CM | POA: Diagnosis not present

## 2024-04-22 MED ORDER — FLUCONAZOLE 150 MG PO TABS: 150.0000 mg | ORAL_TABLET | ORAL | 0 refills | Status: DC | PRN

## 2024-04-22 NOTE — Progress Notes (Signed)

## 2024-05-06 DIAGNOSIS — R350 Frequency of micturition: Secondary | ICD-10-CM | POA: Diagnosis not present

## 2024-05-06 DIAGNOSIS — N760 Acute vaginitis: Secondary | ICD-10-CM | POA: Diagnosis not present

## 2024-05-06 DIAGNOSIS — Z113 Encounter for screening for infections with a predominantly sexual mode of transmission: Secondary | ICD-10-CM | POA: Diagnosis not present

## 2024-05-07 ENCOUNTER — Telehealth: Admitting: Physician Assistant

## 2024-05-07 DIAGNOSIS — R3989 Other symptoms and signs involving the genitourinary system: Secondary | ICD-10-CM | POA: Diagnosis not present

## 2024-05-07 MED ORDER — NITROFURANTOIN MONOHYD MACRO 100 MG PO CAPS: 100.0000 mg | ORAL_CAPSULE | Freq: Two times a day (BID) | ORAL | 0 refills | Status: AC

## 2024-05-07 NOTE — Progress Notes (Signed)

## 2025-03-25 ENCOUNTER — Telehealth: Admitting: Family Medicine
